# Patient Record
Sex: Female | Born: 1945 | Race: White | Hispanic: No | Marital: Married | Smoking: Never smoker
Health system: Southern US, Community
[De-identification: ages and names within clinical notes are randomized; demographics above are authoritative.]

## PROBLEM LIST (undated history)

## (undated) DIAGNOSIS — C50919 Malignant neoplasm of unspecified site of unspecified female breast: Secondary | ICD-10-CM

## (undated) DIAGNOSIS — K5730 Diverticulosis of large intestine without perforation or abscess without bleeding: Secondary | ICD-10-CM

## (undated) DIAGNOSIS — I10 Essential (primary) hypertension: Secondary | ICD-10-CM

## (undated) DIAGNOSIS — C7931 Secondary malignant neoplasm of brain: Secondary | ICD-10-CM

## (undated) DIAGNOSIS — I00 Rheumatic fever without heart involvement: Secondary | ICD-10-CM

## (undated) DIAGNOSIS — I2510 Atherosclerotic heart disease of native coronary artery without angina pectoris: Secondary | ICD-10-CM

## (undated) DIAGNOSIS — F419 Anxiety disorder, unspecified: Secondary | ICD-10-CM

## (undated) DIAGNOSIS — M810 Age-related osteoporosis without current pathological fracture: Secondary | ICD-10-CM

## (undated) DIAGNOSIS — G589 Mononeuropathy, unspecified: Secondary | ICD-10-CM

## (undated) DIAGNOSIS — E785 Hyperlipidemia, unspecified: Secondary | ICD-10-CM

## (undated) DIAGNOSIS — M549 Dorsalgia, unspecified: Secondary | ICD-10-CM

## (undated) HISTORY — PX: CHOLECYSTECTOMY: SHX55

## (undated) HISTORY — DX: Mononeuropathy, unspecified: G58.9

## (undated) HISTORY — DX: Rheumatic fever without heart involvement: I00

## (undated) HISTORY — DX: Hyperlipidemia, unspecified: E78.5

## (undated) HISTORY — DX: Essential (primary) hypertension: I10

## (undated) HISTORY — DX: Dorsalgia, unspecified: M54.9

## (undated) HISTORY — DX: Secondary malignant neoplasm of brain: C79.31

## (undated) HISTORY — DX: Atherosclerotic heart disease of native coronary artery without angina pectoris: I25.10

## (undated) HISTORY — DX: Age-related osteoporosis without current pathological fracture: M81.0

## (undated) HISTORY — DX: Malignant neoplasm of unspecified site of unspecified female breast: C50.919

## (undated) HISTORY — DX: Diverticulosis of large intestine without perforation or abscess without bleeding: K57.30

## (undated) HISTORY — DX: Anxiety disorder, unspecified: F41.9

---

## 1969-01-09 HISTORY — PX: TONSILLECTOMY AND ADENOIDECTOMY: SHX28

## 1972-01-10 HISTORY — PX: ANKLE ARTHROSCOPY W/ OPEN REPAIR: SHX1145

## 1997-12-29 ENCOUNTER — Ambulatory Visit (HOSPITAL_COMMUNITY): Admission: RE | Admit: 1997-12-29 | Discharge: 1997-12-29 | Payer: Self-pay | Admitting: Family Medicine

## 2002-01-09 HISTORY — PX: CAROTID ENDARTERECTOMY: SUR193

## 2004-05-10 ENCOUNTER — Ambulatory Visit: Payer: Self-pay | Admitting: Family Medicine

## 2004-06-13 ENCOUNTER — Ambulatory Visit: Payer: Self-pay | Admitting: General Surgery

## 2004-06-13 LAB — HM COLONOSCOPY

## 2005-01-09 HISTORY — PX: BREAST LUMPECTOMY: SHX2

## 2005-11-03 ENCOUNTER — Ambulatory Visit: Payer: Self-pay | Admitting: General Surgery

## 2005-11-20 ENCOUNTER — Ambulatory Visit: Payer: Self-pay | Admitting: Oncology

## 2005-11-24 ENCOUNTER — Ambulatory Visit: Payer: Self-pay | Admitting: General Surgery

## 2005-12-09 ENCOUNTER — Ambulatory Visit: Payer: Self-pay | Admitting: Oncology

## 2006-01-09 ENCOUNTER — Ambulatory Visit: Payer: Self-pay | Admitting: Oncology

## 2006-02-09 ENCOUNTER — Ambulatory Visit: Payer: Self-pay | Admitting: Oncology

## 2006-03-10 ENCOUNTER — Ambulatory Visit: Payer: Self-pay | Admitting: Oncology

## 2006-04-10 ENCOUNTER — Ambulatory Visit: Payer: Self-pay | Admitting: Oncology

## 2006-04-26 ENCOUNTER — Ambulatory Visit: Payer: Self-pay | Admitting: General Surgery

## 2006-05-10 ENCOUNTER — Ambulatory Visit: Payer: Self-pay | Admitting: Oncology

## 2006-06-10 ENCOUNTER — Ambulatory Visit: Payer: Self-pay | Admitting: Oncology

## 2006-07-10 ENCOUNTER — Ambulatory Visit: Payer: Self-pay | Admitting: Oncology

## 2006-08-10 ENCOUNTER — Ambulatory Visit: Payer: Self-pay | Admitting: Oncology

## 2006-09-10 ENCOUNTER — Ambulatory Visit: Payer: Self-pay | Admitting: Oncology

## 2006-10-10 ENCOUNTER — Ambulatory Visit: Payer: Self-pay | Admitting: Oncology

## 2006-12-10 ENCOUNTER — Ambulatory Visit: Payer: Self-pay | Admitting: Oncology

## 2006-12-10 HISTORY — PX: FOOT FRACTURE SURGERY: SHX645

## 2006-12-27 ENCOUNTER — Ambulatory Visit: Payer: Self-pay | Admitting: Oncology

## 2007-01-10 ENCOUNTER — Ambulatory Visit: Payer: Self-pay | Admitting: Oncology

## 2007-02-10 ENCOUNTER — Ambulatory Visit: Payer: Self-pay | Admitting: Oncology

## 2007-02-28 ENCOUNTER — Ambulatory Visit: Payer: Self-pay | Admitting: Oncology

## 2007-03-10 ENCOUNTER — Ambulatory Visit: Payer: Self-pay | Admitting: Oncology

## 2007-04-10 ENCOUNTER — Ambulatory Visit: Payer: Self-pay | Admitting: Oncology

## 2007-04-30 ENCOUNTER — Ambulatory Visit: Payer: Self-pay | Admitting: Oncology

## 2007-05-10 ENCOUNTER — Ambulatory Visit: Payer: Self-pay | Admitting: Oncology

## 2007-08-10 ENCOUNTER — Ambulatory Visit: Payer: Self-pay | Admitting: Oncology

## 2007-08-21 ENCOUNTER — Ambulatory Visit: Payer: Self-pay | Admitting: Oncology

## 2007-09-10 ENCOUNTER — Ambulatory Visit: Payer: Self-pay | Admitting: Oncology

## 2007-09-26 ENCOUNTER — Ambulatory Visit: Payer: Self-pay | Admitting: General Surgery

## 2007-10-30 ENCOUNTER — Encounter: Payer: Self-pay | Admitting: Internal Medicine

## 2007-10-30 LAB — CONVERTED CEMR LAB

## 2007-12-10 ENCOUNTER — Ambulatory Visit: Payer: Self-pay | Admitting: Family Medicine

## 2008-02-10 ENCOUNTER — Ambulatory Visit: Payer: Self-pay | Admitting: Oncology

## 2008-02-20 ENCOUNTER — Ambulatory Visit: Payer: Self-pay | Admitting: Oncology

## 2008-03-09 ENCOUNTER — Ambulatory Visit: Payer: Self-pay | Admitting: Oncology

## 2008-06-09 ENCOUNTER — Ambulatory Visit: Payer: Self-pay | Admitting: Oncology

## 2008-06-17 ENCOUNTER — Encounter: Payer: Self-pay | Admitting: Internal Medicine

## 2008-07-03 ENCOUNTER — Encounter: Payer: Self-pay | Admitting: Internal Medicine

## 2008-07-03 ENCOUNTER — Ambulatory Visit: Payer: Self-pay | Admitting: Oncology

## 2008-07-09 ENCOUNTER — Ambulatory Visit: Payer: Self-pay | Admitting: Oncology

## 2008-08-18 ENCOUNTER — Ambulatory Visit: Payer: Self-pay | Admitting: Internal Medicine

## 2008-08-18 DIAGNOSIS — M549 Dorsalgia, unspecified: Secondary | ICD-10-CM | POA: Insufficient documentation

## 2008-08-18 DIAGNOSIS — R002 Palpitations: Secondary | ICD-10-CM

## 2008-08-18 DIAGNOSIS — I10 Essential (primary) hypertension: Secondary | ICD-10-CM | POA: Insufficient documentation

## 2008-08-18 DIAGNOSIS — Z853 Personal history of malignant neoplasm of breast: Secondary | ICD-10-CM | POA: Insufficient documentation

## 2008-08-18 DIAGNOSIS — L500 Allergic urticaria: Secondary | ICD-10-CM

## 2008-08-18 DIAGNOSIS — K5730 Diverticulosis of large intestine without perforation or abscess without bleeding: Secondary | ICD-10-CM | POA: Insufficient documentation

## 2008-08-18 DIAGNOSIS — E785 Hyperlipidemia, unspecified: Secondary | ICD-10-CM

## 2008-08-18 DIAGNOSIS — G589 Mononeuropathy, unspecified: Secondary | ICD-10-CM | POA: Insufficient documentation

## 2008-08-18 DIAGNOSIS — F411 Generalized anxiety disorder: Secondary | ICD-10-CM | POA: Insufficient documentation

## 2008-08-18 DIAGNOSIS — M810 Age-related osteoporosis without current pathological fracture: Secondary | ICD-10-CM | POA: Insufficient documentation

## 2008-08-31 ENCOUNTER — Telehealth: Payer: Self-pay | Admitting: Internal Medicine

## 2008-09-02 ENCOUNTER — Encounter: Payer: Self-pay | Admitting: Internal Medicine

## 2008-09-22 ENCOUNTER — Ambulatory Visit: Payer: Self-pay | Admitting: General Surgery

## 2008-09-22 ENCOUNTER — Encounter: Payer: Self-pay | Admitting: Internal Medicine

## 2008-09-29 ENCOUNTER — Ambulatory Visit: Payer: Self-pay | Admitting: General Surgery

## 2008-10-13 ENCOUNTER — Telehealth: Payer: Self-pay | Admitting: Internal Medicine

## 2008-10-14 ENCOUNTER — Ambulatory Visit: Payer: Self-pay | Admitting: Family Medicine

## 2008-10-14 DIAGNOSIS — S335XXA Sprain of ligaments of lumbar spine, initial encounter: Secondary | ICD-10-CM | POA: Insufficient documentation

## 2008-10-21 ENCOUNTER — Telehealth: Payer: Self-pay | Admitting: Family Medicine

## 2008-10-23 ENCOUNTER — Telehealth: Payer: Self-pay | Admitting: Internal Medicine

## 2008-11-24 ENCOUNTER — Telehealth: Payer: Self-pay | Admitting: Internal Medicine

## 2008-12-04 ENCOUNTER — Ambulatory Visit: Payer: Self-pay | Admitting: Internal Medicine

## 2008-12-09 ENCOUNTER — Telehealth: Payer: Self-pay | Admitting: Internal Medicine

## 2008-12-09 ENCOUNTER — Ambulatory Visit: Payer: Self-pay | Admitting: Oncology

## 2008-12-09 ENCOUNTER — Telehealth: Payer: Self-pay | Admitting: Family Medicine

## 2008-12-09 ENCOUNTER — Ambulatory Visit: Payer: Self-pay | Admitting: Family Medicine

## 2008-12-09 DIAGNOSIS — IMO0002 Reserved for concepts with insufficient information to code with codable children: Secondary | ICD-10-CM | POA: Insufficient documentation

## 2008-12-09 DIAGNOSIS — M1610 Unilateral primary osteoarthritis, unspecified hip: Secondary | ICD-10-CM | POA: Insufficient documentation

## 2008-12-09 DIAGNOSIS — M5137 Other intervertebral disc degeneration, lumbosacral region: Secondary | ICD-10-CM

## 2008-12-09 DIAGNOSIS — M169 Osteoarthritis of hip, unspecified: Secondary | ICD-10-CM

## 2008-12-14 ENCOUNTER — Telehealth: Payer: Self-pay | Admitting: Internal Medicine

## 2008-12-16 ENCOUNTER — Ambulatory Visit: Payer: Self-pay | Admitting: Internal Medicine

## 2008-12-18 ENCOUNTER — Telehealth: Payer: Self-pay | Admitting: Internal Medicine

## 2008-12-21 ENCOUNTER — Telehealth: Payer: Self-pay | Admitting: Internal Medicine

## 2008-12-28 ENCOUNTER — Ambulatory Visit: Payer: Self-pay | Admitting: Oncology

## 2008-12-29 ENCOUNTER — Telehealth: Payer: Self-pay | Admitting: Internal Medicine

## 2008-12-31 ENCOUNTER — Telehealth: Payer: Self-pay | Admitting: Internal Medicine

## 2009-01-05 ENCOUNTER — Telehealth: Payer: Self-pay | Admitting: Internal Medicine

## 2009-01-06 ENCOUNTER — Telehealth: Payer: Self-pay | Admitting: Internal Medicine

## 2009-01-07 ENCOUNTER — Ambulatory Visit: Payer: Self-pay | Admitting: Internal Medicine

## 2009-01-09 ENCOUNTER — Ambulatory Visit: Payer: Self-pay | Admitting: Oncology

## 2009-01-14 ENCOUNTER — Telehealth: Payer: Self-pay | Admitting: Internal Medicine

## 2009-01-20 ENCOUNTER — Telehealth: Payer: Self-pay | Admitting: Internal Medicine

## 2009-01-28 ENCOUNTER — Ambulatory Visit: Payer: Self-pay | Admitting: Oncology

## 2009-02-09 ENCOUNTER — Inpatient Hospital Stay: Payer: Self-pay | Admitting: Internal Medicine

## 2009-02-09 ENCOUNTER — Ambulatory Visit: Payer: Self-pay | Admitting: Oncology

## 2009-02-16 ENCOUNTER — Telehealth: Payer: Self-pay | Admitting: Internal Medicine

## 2009-02-19 ENCOUNTER — Telehealth: Payer: Self-pay | Admitting: Internal Medicine

## 2009-02-21 ENCOUNTER — Emergency Department: Payer: Self-pay | Admitting: Emergency Medicine

## 2009-02-26 ENCOUNTER — Inpatient Hospital Stay: Payer: Self-pay | Admitting: Oncology

## 2009-03-09 ENCOUNTER — Ambulatory Visit: Payer: Self-pay | Admitting: Oncology

## 2009-04-09 ENCOUNTER — Ambulatory Visit: Payer: Self-pay | Admitting: Oncology

## 2009-05-09 ENCOUNTER — Ambulatory Visit: Payer: Self-pay | Admitting: Oncology

## 2009-06-09 ENCOUNTER — Ambulatory Visit: Payer: Self-pay | Admitting: Oncology

## 2009-07-09 ENCOUNTER — Ambulatory Visit: Payer: Self-pay | Admitting: Oncology

## 2009-07-15 LAB — CANCER ANTIGEN 27.29: CA 27.29: 32.1 U/mL (ref 0.0–38.6)

## 2009-07-23 ENCOUNTER — Ambulatory Visit: Payer: Self-pay | Admitting: Oncology

## 2009-07-30 LAB — CANCER ANTIGEN 27.29: CA 27.29: 22.4 U/mL

## 2009-08-09 ENCOUNTER — Ambulatory Visit: Payer: Self-pay | Admitting: Oncology

## 2009-08-26 LAB — CANCER ANTIGEN 27.29: CA 27.29: 22.2 U/mL (ref 0.0–38.6)

## 2009-08-31 ENCOUNTER — Encounter: Payer: Self-pay | Admitting: Oncology

## 2009-09-09 ENCOUNTER — Ambulatory Visit: Payer: Self-pay | Admitting: Oncology

## 2009-09-09 ENCOUNTER — Encounter: Payer: Self-pay | Admitting: Oncology

## 2009-10-09 ENCOUNTER — Ambulatory Visit: Payer: Self-pay | Admitting: Oncology

## 2009-11-09 ENCOUNTER — Ambulatory Visit: Payer: Self-pay | Admitting: Oncology

## 2009-12-09 ENCOUNTER — Ambulatory Visit: Payer: Self-pay | Admitting: Oncology

## 2010-01-09 ENCOUNTER — Ambulatory Visit: Payer: Self-pay | Admitting: Oncology

## 2010-02-06 LAB — CONVERTED CEMR LAB
ALT: 20 units/L (ref 0–35)
AST: 20 units/L (ref 0–37)
Albumin: 4.6 g/dL (ref 3.5–5.2)
Alkaline Phosphatase: 86 units/L (ref 39–117)
BUN: 8 mg/dL (ref 6–23)
Basophils Absolute: 0 10*3/uL (ref 0.0–0.1)
Basophils Relative: 0.1 % (ref 0.0–3.0)
Bilirubin Urine: NEGATIVE
Bilirubin, Direct: 0 mg/dL (ref 0.0–0.3)
CO2: 34 meq/L — ABNORMAL HIGH (ref 19–32)
Calcium: 9.5 mg/dL (ref 8.4–10.5)
Chloride: 103 meq/L (ref 96–112)
Cholesterol: 209 mg/dL — ABNORMAL HIGH (ref 0–200)
Creatinine, Ser: 0.8 mg/dL (ref 0.4–1.2)
Direct LDL: 127.9 mg/dL
Eosinophils Absolute: 0.1 10*3/uL (ref 0.0–0.7)
Eosinophils Relative: 2 % (ref 0.0–5.0)
Free T4: 0.7 ng/dL (ref 0.6–1.6)
Glucose, Bld: 76 mg/dL (ref 70–99)
Glucose, Urine, Semiquant: NEGATIVE
HCT: 40.1 % (ref 36.0–46.0)
HDL: 67.2 mg/dL (ref >39.00)
Hemoglobin: 13.7 g/dL (ref 12.0–15.0)
Ketones, urine, test strip: NEGATIVE
Lymphocytes Relative: 15.4 % (ref 12.0–46.0)
Lymphs Abs: 0.9 10*3/uL (ref 0.7–4.0)
MCHC: 34.2 g/dL (ref 30.0–36.0)
MCV: 99.4 fL (ref 78.0–100.0)
Monocytes Absolute: 0.6 10*3/uL (ref 0.1–1.0)
Monocytes Relative: 10.2 % (ref 3.0–12.0)
Neutro Abs: 4.3 10*3/uL (ref 1.4–7.7)
Neutrophils Relative %: 72.3 % (ref 43.0–77.0)
Nitrite: NEGATIVE
Phosphorus: 4.3 mg/dL (ref 2.3–4.6)
Platelets: 290 10*3/uL (ref 150.0–400.0)
Potassium: 3.9 meq/L (ref 3.5–5.1)
RBC: 4.03 M/uL (ref 3.87–5.11)
RDW: 12.3 % (ref 11.5–14.6)
Sodium: 145 meq/L (ref 135–145)
Specific Gravity, Urine: 1.025
TSH: 1.56 microintl units/mL (ref 0.35–5.50)
Total Bilirubin: 0.9 mg/dL (ref 0.3–1.2)
Total CHOL/HDL Ratio: 3
Total Protein: 7.3 g/dL (ref 6.0–8.3)
Triglycerides: 139 mg/dL (ref 0.0–149.0)
Urobilinogen, UA: 0.2
VLDL: 27.8 mg/dL (ref 0.0–40.0)
Vit D, 25-Hydroxy: 25 ng/mL — ABNORMAL LOW (ref 30–89)
WBC: 5.9 10*3/uL (ref 4.5–10.5)
pH: 6.5

## 2010-02-09 ENCOUNTER — Ambulatory Visit: Payer: Self-pay | Admitting: Oncology

## 2010-02-10 NOTE — Progress Notes (Signed)
Summary: refill request for xanax  Phone Note Refill Request Message from:  Fax from Pharmacy  Refills Requested: Medication #1:  ALPRAZOLAM 0.5 MG TABS take 1/2 to 1 by mouth two times a day as needed   Last Refilled: 12/31/2008 Faxed request from [employer] is on your desk.  Initial call taken by: Lowella Petties CMA,  February 16, 2009 2:52 PM  Follow-up for Phone Call        okay #60 x 1 Follow-up by: Cindee Salt MD,  February 16, 2009 3:02 PM  Additional Follow-up for Phone Call Additional follow up Details #1::        Rx faxed to pharmacy Additional Follow-up by: DeShannon Smith CMA Duncan Dull),  February 16, 2009 3:31 PM    Prescriptions: ALPRAZOLAM 0.5 MG TABS (ALPRAZOLAM) take 1/2 to 1 by mouth two times a day as needed  #60 x 1   Entered by:   Mervin Hack CMA (AAMA)   Authorized by:   Cindee Salt MD   Signed by:   Mervin Hack CMA (AAMA) on 02/16/2009   Method used:   Handwritten   RxID:   [id number]

## 2010-02-10 NOTE — Progress Notes (Signed)
Summary: Percocet, Tizanidine and Fentanyl  Phone Note Call from Patient Call back at [phone]   Caller: Patient Call For: Donna Salt MD Summary of Call: Patient needs written rx's for Percocet, Tizanidine 4mg  and  Fentanyl patch. Patient states that the dose of the Fentanyl patch is not helping that much and wants to know if you want to increase that?  Patient's husband would like to pick them up this afternoon. Initial call taken by: Sydell Axon LPN,  January 20, 2009 1:04 PM  Follow-up for Phone Call        Patch increased to 50 micrograms (doubled the dose) tizanidine not due--2 refills written in December Follow-up by: Donna Salt MD,  January 20, 2009 1:40 PM  Additional Follow-up for Phone Call Additional follow up Details #1::        Spoke with patient and advised rx ready for pick-up, her husband will pick-up and she will call the tizanidine in to the pharmacy. Additional Follow-up by: Mervin Hack CMA Duncan Dull),  January 20, 2009 2:48 PM    New/Updated Medications: FENTANYL 50 MCG/HR PT72 (FENTANYL) apply 1 patch every 3 days Prescriptions: PERCOCET 5-325 MG TABS (OXYCODONE-ACETAMINOPHEN) 1 - 2 tabs every 4-6 hours by mouth as needed for pain  #90 x 0   Entered and Authorized by:   Donna Salt MD   Signed by:   Donna Salt MD on 01/20/2009   Method used:   Print then Give to Patient   RxID:   [id number] FENTANYL 50 MCG/HR PT72 (FENTANYL) apply 1 patch every 3 days  #10 x 0   Entered and Authorized by:   Donna Salt MD   Signed by:   Donna Salt MD on 01/20/2009   Method used:   Print then Give to Patient   RxID:   [id number]

## 2010-02-10 NOTE — Progress Notes (Signed)
Summary: Knee pain  Phone Note Call from Patient Call back at Home Phone [phone]   Caller: Patient Summary of Call: patient and husband calling complaining of a swollen knee, I advised pt and husband that Dr. Alphonsus Sias is not in and that I could get her in to see another physican, husband states he doesn't trust the other physicans here. Pt and husband wanted [place] to call Dr. Koleen Nimrod and see if he could see pt, I told them we couldn't do that, they would need to call, husband said he would call and call us back. Initial call taken by: Mervin Hack CMA Duncan Dull),  January 14, 2009 8:46 AM  Follow-up for Phone Call        called to check on pt and she was seen today by Dr. Montez Morita (Dr. Koleen Nimrod was out) they did a ultrasound which was negative and put pt on prednisone. Follow-up by: Mervin Hack CMA Duncan Dull),  January 14, 2009 2:05 PM  Additional Follow-up for Phone Call Additional follow up Details #1::        Please check on her on Monday Okay to add on if she needs to be seen Additional Follow-up by: Cindee Salt MD,  January 15, 2009 8:30 PM    Additional Follow-up for Phone Call Additional follow up Details #2::    no answer at home number [facility] CMA Duncan Dull)  January 19, 2009 9:35 AM

## 2010-02-10 NOTE — Progress Notes (Signed)
----   Converted from flag ---- ---- 02/19/2009 5:50 PM, Cindee Salt MD wrote: Sherron Monday to patient then husband Bone scan and MRI showed cancer Now on RT and chemo  ---- 02/19/2009 2:23 PM, DeShannon Katrinka Blazing CMA Duncan Dull) wrote: Cleatis Polka called and left message on triage voicemail that his wife has cancer and that's all he's gonna say because he wasn't going to talk to a recording. Do you want me to call him or do you want to? ------------------------------

## 2010-03-10 ENCOUNTER — Ambulatory Visit: Payer: Self-pay | Admitting: Oncology

## 2010-04-05 ENCOUNTER — Ambulatory Visit: Payer: Self-pay | Admitting: Oncology

## 2010-04-10 ENCOUNTER — Ambulatory Visit: Payer: Self-pay | Admitting: Oncology

## 2010-05-10 ENCOUNTER — Ambulatory Visit: Payer: Self-pay | Admitting: Oncology

## 2010-06-10 ENCOUNTER — Ambulatory Visit: Payer: Self-pay | Admitting: Oncology

## 2010-07-10 ENCOUNTER — Ambulatory Visit: Payer: Self-pay | Admitting: Oncology

## 2010-08-02 LAB — CEA: CEA: 3.6 ng/mL (ref 0.0–4.7)

## 2010-08-10 ENCOUNTER — Ambulatory Visit: Payer: Self-pay | Admitting: Oncology

## 2010-09-10 ENCOUNTER — Ambulatory Visit: Payer: Self-pay | Admitting: Oncology

## 2010-09-27 LAB — CANCER ANTIGEN 27.29: CA 27.29: 36.8 U/mL (ref 0.0–38.6)

## 2010-10-10 ENCOUNTER — Ambulatory Visit: Payer: Self-pay | Admitting: Oncology

## 2010-10-25 LAB — CANCER ANTIGEN 27.29: CA 27.29: 43.8 U/mL — ABNORMAL HIGH (ref 0.0–38.6)

## 2010-11-10 ENCOUNTER — Ambulatory Visit: Payer: Self-pay | Admitting: Oncology

## 2010-12-10 ENCOUNTER — Ambulatory Visit: Payer: Self-pay | Admitting: Oncology

## 2010-12-19 ENCOUNTER — Ambulatory Visit: Payer: Self-pay | Admitting: Oncology

## 2011-01-10 ENCOUNTER — Ambulatory Visit: Payer: Self-pay | Admitting: Oncology

## 2011-01-23 LAB — CBC CANCER CENTER
Basophil #: 0 x10 3/mm (ref 0.0–0.1)
Eosinophil #: 0.1 x10 3/mm (ref 0.0–0.7)
HCT: 37.4 % (ref 35.0–47.0)
HGB: 13 g/dL (ref 12.0–16.0)
Lymphocyte %: 15.8 %
MCH: 30.7 pg (ref 26.0–34.0)
MCHC: 34.7 g/dL (ref 32.0–36.0)
MCV: 89 fL (ref 80–100)
Monocyte #: 0.3 x10 3/mm (ref 0.0–0.7)
Monocyte %: 7.6 %
Neutrophil #: 2.6 x10 3/mm (ref 1.4–6.5)
Neutrophil %: 72.6 %
Platelet: 194 x10 3/mm (ref 150–440)
RBC: 4.22 10*6/uL (ref 3.80–5.20)
RDW: 12.6 % (ref 11.5–14.5)

## 2011-01-23 LAB — COMPREHENSIVE METABOLIC PANEL
Albumin: 3.6 g/dL (ref 3.4–5.0)
Alkaline Phosphatase: 117 U/L (ref 50–136)
BUN: 7 mg/dL (ref 7–18)
Creatinine: 1.4 mg/dL — ABNORMAL HIGH (ref 0.60–1.30)
Glucose: 124 mg/dL — ABNORMAL HIGH (ref 65–99)
Potassium: 3.1 mmol/L — ABNORMAL LOW (ref 3.5–5.1)
SGOT(AST): 22 U/L (ref 15–37)
SGPT (ALT): 37 U/L
Total Protein: 6.9 g/dL (ref 6.4–8.2)

## 2011-01-24 LAB — CANCER ANTIGEN 27.29: CA 27.29: 32.8 U/mL (ref 0.0–38.6)

## 2011-02-10 ENCOUNTER — Ambulatory Visit: Payer: Self-pay | Admitting: Oncology

## 2011-02-20 LAB — CBC CANCER CENTER
Basophil #: 0 x10 3/mm (ref 0.0–0.1)
Eosinophil #: 0.1 x10 3/mm (ref 0.0–0.7)
Eosinophil %: 2.8 %
HCT: 35.8 % (ref 35.0–47.0)
Lymphocyte %: 14.7 %
MCHC: 34 g/dL (ref 32.0–36.0)
Monocyte #: 0.4 x10 3/mm (ref 0.0–0.7)
Monocyte %: 8.9 %
Neutrophil %: 72.9 %
Platelet: 302 x10 3/mm (ref 150–440)
RBC: 4.15 10*6/uL (ref 3.80–5.20)
WBC: 4.6 x10 3/mm (ref 3.6–11.0)

## 2011-02-20 LAB — COMPREHENSIVE METABOLIC PANEL
Albumin: 3.4 g/dL (ref 3.4–5.0)
Alkaline Phosphatase: 114 U/L (ref 50–136)
Anion Gap: 10 (ref 7–16)
BUN: 9 mg/dL (ref 7–18)
Bilirubin,Total: 0.7 mg/dL (ref 0.2–1.0)
Calcium, Total: 8.8 mg/dL (ref 8.5–10.1)
Chloride: 103 mmol/L (ref 98–107)
Co2: 30 mmol/L (ref 21–32)
Creatinine: 1.33 mg/dL — ABNORMAL HIGH (ref 0.60–1.30)
EGFR (Non-African Amer.): 42 — ABNORMAL LOW
Osmolality: 284 (ref 275–301)
SGOT(AST): 17 U/L (ref 15–37)
SGPT (ALT): 23 U/L
Total Protein: 7.1 g/dL (ref 6.4–8.2)

## 2011-02-21 LAB — CANCER ANTIGEN 27.29: CA 27.29: 40.4 U/mL — ABNORMAL HIGH (ref 0.0–38.6)

## 2011-03-10 ENCOUNTER — Ambulatory Visit: Payer: Self-pay | Admitting: Oncology

## 2011-03-20 LAB — COMPREHENSIVE METABOLIC PANEL
Albumin: 2.9 g/dL — ABNORMAL LOW (ref 3.4–5.0)
Alkaline Phosphatase: 102 U/L (ref 50–136)
BUN: 21 mg/dL — ABNORMAL HIGH (ref 7–18)
Glucose: 133 mg/dL — ABNORMAL HIGH (ref 65–99)
Potassium: 3.5 mmol/L (ref 3.5–5.1)
SGOT(AST): 22 U/L (ref 15–37)
SGPT (ALT): 19 U/L
Total Protein: 7.5 g/dL (ref 6.4–8.2)

## 2011-03-20 LAB — CBC CANCER CENTER
Basophil #: 0 x10 3/mm (ref 0.0–0.1)
Eosinophil #: 0.1 x10 3/mm (ref 0.0–0.7)
Eosinophil %: 1 %
Lymphocyte #: 0.5 x10 3/mm — ABNORMAL LOW (ref 1.0–3.6)
Lymphocyte %: 6.2 %
MCHC: 34 g/dL (ref 32.0–36.0)
MCV: 80 fL (ref 80–100)
Monocyte #: 0.6 x10 3/mm (ref 0.0–0.7)
Monocyte %: 7.3 %
Neutrophil #: 6.7 x10 3/mm — ABNORMAL HIGH (ref 1.4–6.5)
Neutrophil %: 85.2 %
Platelet: 320 x10 3/mm (ref 150–440)
RBC: 3.65 10*6/uL — ABNORMAL LOW (ref 3.80–5.20)
RDW: 14.7 % — ABNORMAL HIGH (ref 11.5–14.5)
WBC: 7.8 x10 3/mm (ref 3.6–11.0)

## 2011-03-27 LAB — COMPREHENSIVE METABOLIC PANEL
Anion Gap: 12 (ref 7–16)
Bilirubin,Total: 0.9 mg/dL (ref 0.2–1.0)
Chloride: 101 mmol/L (ref 98–107)
Co2: 26 mmol/L (ref 21–32)
Creatinine: 1.54 mg/dL — ABNORMAL HIGH (ref 0.60–1.30)
EGFR (African American): 43 — ABNORMAL LOW
EGFR (Non-African Amer.): 36 — ABNORMAL LOW
Osmolality: 277 (ref 275–301)
Potassium: 3.2 mmol/L — ABNORMAL LOW (ref 3.5–5.1)
SGPT (ALT): 25 U/L
Sodium: 139 mmol/L (ref 136–145)
Total Protein: 7.3 g/dL (ref 6.4–8.2)

## 2011-03-27 LAB — CBC WITH DIFFERENTIAL/PLATELET
Basophil #: 0 10*3/uL (ref 0.0–0.1)
Basophil %: 0.7 %
Eosinophil #: 0.1 10*3/uL (ref 0.0–0.7)
Eosinophil %: 1.4 %
Lymphocyte #: 0.5 10*3/uL — ABNORMAL LOW (ref 1.0–3.6)
MCH: 26.8 pg (ref 26.0–34.0)
MCHC: 33.8 g/dL (ref 32.0–36.0)
MCV: 79 fL — ABNORMAL LOW (ref 80–100)
Monocyte #: 0.5 10*3/uL (ref 0.0–0.7)
Platelet: 490 10*3/uL — ABNORMAL HIGH (ref 150–440)
RBC: 3.37 10*6/uL — ABNORMAL LOW (ref 3.80–5.20)

## 2011-04-04 LAB — CBC CANCER CENTER
Basophil #: 0 x10 3/mm (ref 0.0–0.1)
Basophil %: 0.4 %
HCT: 26.4 % — ABNORMAL LOW (ref 35.0–47.0)
HGB: 8.9 g/dL — ABNORMAL LOW (ref 12.0–16.0)
MCHC: 33.5 g/dL (ref 32.0–36.0)
MCV: 80 fL (ref 80–100)
Monocyte #: 0.4 x10 3/mm (ref 0.0–0.7)
Neutrophil #: 5.9 x10 3/mm (ref 1.4–6.5)
Neutrophil %: 81.5 %
Platelet: 500 x10 3/mm — ABNORMAL HIGH (ref 150–440)
RDW: 16.4 % — ABNORMAL HIGH (ref 11.5–14.5)

## 2011-04-04 LAB — COMPREHENSIVE METABOLIC PANEL
Albumin: 3.1 g/dL — ABNORMAL LOW (ref 3.4–5.0)
BUN: 14 mg/dL (ref 7–18)
Calcium, Total: 9.4 mg/dL (ref 8.5–10.1)
Chloride: 101 mmol/L (ref 98–107)
Co2: 27 mmol/L (ref 21–32)
Creatinine: 1.65 mg/dL — ABNORMAL HIGH (ref 0.60–1.30)
Glucose: 75 mg/dL (ref 65–99)
SGOT(AST): 27 U/L (ref 15–37)
SGPT (ALT): 27 U/L
Sodium: 140 mmol/L (ref 136–145)

## 2011-04-10 ENCOUNTER — Ambulatory Visit: Payer: Self-pay | Admitting: Oncology

## 2011-04-13 ENCOUNTER — Ambulatory Visit: Payer: Self-pay | Admitting: General Surgery

## 2011-04-17 ENCOUNTER — Ambulatory Visit: Payer: Self-pay | Admitting: Oncology

## 2011-04-17 LAB — CBC CANCER CENTER
Basophil %: 0.6 %
HGB: 9.4 g/dL — ABNORMAL LOW (ref 12.0–16.0)
Lymphocyte #: 0.7 x10 3/mm — ABNORMAL LOW (ref 1.0–3.6)
MCH: 27.8 pg (ref 26.0–34.0)
MCHC: 33.7 g/dL (ref 32.0–36.0)
MCV: 83 fL (ref 80–100)
Monocyte %: 7.3 %
Neutrophil #: 4.8 x10 3/mm (ref 1.4–6.5)
Neutrophil %: 75.7 %
RDW: 23 % — ABNORMAL HIGH (ref 11.5–14.5)

## 2011-04-17 LAB — BASIC METABOLIC PANEL
Calcium, Total: 10 mg/dL (ref 8.5–10.1)
Creatinine: 1.64 mg/dL — ABNORMAL HIGH (ref 0.60–1.30)
EGFR (African American): 40 — ABNORMAL LOW
EGFR (Non-African Amer.): 33 — ABNORMAL LOW
Glucose: 89 mg/dL (ref 65–99)
Osmolality: 277 (ref 275–301)
Sodium: 138 mmol/L (ref 136–145)

## 2011-04-23 ENCOUNTER — Emergency Department: Payer: Self-pay | Admitting: Emergency Medicine

## 2011-04-23 LAB — COMPREHENSIVE METABOLIC PANEL
Albumin: 3.1 g/dL — ABNORMAL LOW (ref 3.4–5.0)
Anion Gap: 8 (ref 7–16)
BUN: 13 mg/dL (ref 7–18)
Bilirubin,Total: 0.9 mg/dL (ref 0.2–1.0)
Co2: 29 mmol/L (ref 21–32)
Creatinine: 1.29 mg/dL (ref 0.60–1.30)
EGFR (African American): 50 — ABNORMAL LOW
SGPT (ALT): 14 U/L
Total Protein: 6 g/dL — ABNORMAL LOW (ref 6.4–8.2)

## 2011-04-23 LAB — URINALYSIS, COMPLETE
Bilirubin,UR: NEGATIVE
Hyaline Cast: 1
Protein: 100
RBC,UR: <1 /HPF (ref 0–5)
Specific Gravity: 1.02 (ref 1.003–1.030)
Squamous Epithelial: 1

## 2011-04-23 LAB — CBC
MCHC: 32.9 g/dL (ref 32.0–36.0)
MCV: 82 fL (ref 80–100)

## 2011-04-24 LAB — CBC CANCER CENTER
Basophil %: 0.7 %
Eosinophil %: 1.1 %
HCT: 24.9 % — ABNORMAL LOW (ref 35.0–47.0)
HGB: 8.1 g/dL — ABNORMAL LOW (ref 12.0–16.0)
Lymphocyte #: 0.5 x10 3/mm — ABNORMAL LOW (ref 1.0–3.6)
Lymphocyte %: 15.9 %
MCH: 27 pg (ref 26.0–34.0)
MCHC: 32.5 g/dL (ref 32.0–36.0)
MCV: 83 fL (ref 80–100)
Neutrophil %: 73.7 %
RBC: 3 10*6/uL — ABNORMAL LOW (ref 3.80–5.20)
RDW: 22.7 % — ABNORMAL HIGH (ref 11.5–14.5)

## 2011-04-25 LAB — URINE CULTURE

## 2011-04-27 LAB — COMPREHENSIVE METABOLIC PANEL
Alkaline Phosphatase: 86 U/L (ref 50–136)
BUN: 8 mg/dL (ref 7–18)
Bilirubin,Total: 0.6 mg/dL (ref 0.2–1.0)
Calcium, Total: 9.2 mg/dL (ref 8.5–10.1)
Co2: 29 mmol/L (ref 21–32)
Creatinine: 1.42 mg/dL — ABNORMAL HIGH (ref 0.60–1.30)
EGFR (African American): 44 — ABNORMAL LOW
Glucose: 99 mg/dL (ref 65–99)
Potassium: 3.1 mmol/L — ABNORMAL LOW (ref 3.5–5.1)
SGOT(AST): 18 U/L (ref 15–37)
Total Protein: 6.2 g/dL — ABNORMAL LOW (ref 6.4–8.2)

## 2011-04-27 LAB — CBC CANCER CENTER
Basophil #: 0 x10 3/mm (ref 0.0–0.1)
Eosinophil #: 0 x10 3/mm (ref 0.0–0.7)
Lymphocyte #: 0.5 x10 3/mm — ABNORMAL LOW (ref 1.0–3.6)
MCH: 26.8 pg (ref 26.0–34.0)
Monocyte #: 0.5 x10 3/mm (ref 0.2–0.9)
Neutrophil #: 0.9 x10 3/mm — ABNORMAL LOW (ref 1.4–6.5)
Neutrophil %: 45.6 %
Platelet: 322 x10 3/mm (ref 150–440)
WBC: 1.9 x10 3/mm — CL (ref 3.6–11.0)

## 2011-05-02 LAB — CBC CANCER CENTER
Eosinophil #: 0 x10 3/mm (ref 0.0–0.7)
HCT: 35.6 % (ref 35.0–47.0)
HGB: 11.5 g/dL — ABNORMAL LOW (ref 12.0–16.0)
Lymphocyte #: 0.7 x10 3/mm — ABNORMAL LOW (ref 1.0–3.6)
MCH: 26.8 pg (ref 26.0–34.0)
MCHC: 32.3 g/dL (ref 32.0–36.0)
MCV: 83 fL (ref 80–100)
Monocyte #: 0.6 x10 3/mm (ref 0.2–0.9)
Monocyte %: 7.1 %
Neutrophil #: 6.9 x10 3/mm — ABNORMAL HIGH (ref 1.4–6.5)
Neutrophil %: 83.6 %
RBC: 4.29 10*6/uL (ref 3.80–5.20)
RDW: 20.1 % — ABNORMAL HIGH (ref 11.5–14.5)
WBC: 8.2 x10 3/mm (ref 3.6–11.0)

## 2011-05-02 LAB — COMPREHENSIVE METABOLIC PANEL
Anion Gap: 10 (ref 7–16)
BUN: 15 mg/dL (ref 7–18)
Bilirubin,Total: 0.9 mg/dL (ref 0.2–1.0)
Calcium, Total: 9.5 mg/dL (ref 8.5–10.1)
Chloride: 102 mmol/L (ref 98–107)
Creatinine: 1.41 mg/dL — ABNORMAL HIGH (ref 0.60–1.30)
EGFR (Non-African Amer.): 38 — ABNORMAL LOW
Glucose: 79 mg/dL (ref 65–99)
SGOT(AST): 19 U/L (ref 15–37)
Sodium: 142 mmol/L (ref 136–145)

## 2011-05-08 LAB — COMPREHENSIVE METABOLIC PANEL
Albumin: 3 g/dL — ABNORMAL LOW (ref 3.4–5.0)
Alkaline Phosphatase: 92 U/L (ref 50–136)
Bilirubin,Total: 0.7 mg/dL (ref 0.2–1.0)
Calcium, Total: 8.5 mg/dL (ref 8.5–10.1)
Chloride: 104 mmol/L (ref 98–107)
Co2: 26 mmol/L (ref 21–32)
EGFR (African American): 59 — ABNORMAL LOW
Glucose: 79 mg/dL (ref 65–99)
Osmolality: 276 (ref 275–301)
Potassium: 3.6 mmol/L (ref 3.5–5.1)
SGPT (ALT): 14 U/L

## 2011-05-08 LAB — CBC CANCER CENTER
Basophil %: 0.5 %
Eosinophil #: 0 x10 3/mm (ref 0.0–0.7)
Eosinophil %: 0.2 %
HCT: 35.3 % (ref 35.0–47.0)
HGB: 11.4 g/dL — ABNORMAL LOW (ref 12.0–16.0)
Lymphocyte %: 7.5 %
MCH: 26.9 pg (ref 26.0–34.0)
Monocyte #: 0.7 x10 3/mm (ref 0.2–0.9)
Monocyte %: 5.8 %
Neutrophil %: 86 %
Platelet: 392 x10 3/mm (ref 150–440)

## 2011-05-10 ENCOUNTER — Ambulatory Visit: Payer: Self-pay | Admitting: Oncology

## 2011-05-10 HISTORY — PX: OTHER SURGICAL HISTORY: SHX169

## 2011-05-15 LAB — CBC CANCER CENTER
Basophil #: 0 x10 3/mm (ref 0.0–0.1)
Basophil %: 1.2 %
Eosinophil #: 0 x10 3/mm (ref 0.0–0.7)
HCT: 30.6 % — ABNORMAL LOW (ref 35.0–47.0)
Lymphocyte #: 0.5 x10 3/mm — ABNORMAL LOW (ref 1.0–3.6)
Lymphocyte %: 22 %
MCH: 27.5 pg (ref 26.0–34.0)
MCHC: 33.1 g/dL (ref 32.0–36.0)
MCV: 83 fL (ref 80–100)
Monocyte #: 0.2 x10 3/mm (ref 0.2–0.9)
Monocyte %: 10.6 %
Neutrophil #: 1.5 x10 3/mm (ref 1.4–6.5)
Platelet: 339 x10 3/mm (ref 150–440)
RBC: 3.68 10*6/uL — ABNORMAL LOW (ref 3.80–5.20)
WBC: 2.3 x10 3/mm — ABNORMAL LOW (ref 3.6–11.0)

## 2011-05-15 LAB — BASIC METABOLIC PANEL
BUN: 8 mg/dL (ref 7–18)
Calcium, Total: 8.8 mg/dL (ref 8.5–10.1)
Co2: 33 mmol/L — ABNORMAL HIGH (ref 21–32)
EGFR (African American): >60
EGFR (Non-African Amer.): >60
Glucose: 78 mg/dL (ref 65–99)

## 2011-05-19 LAB — COMPREHENSIVE METABOLIC PANEL
Albumin: 2.8 g/dL — ABNORMAL LOW (ref 3.4–5.0)
Alkaline Phosphatase: 117 U/L (ref 50–136)
BUN: 20 mg/dL — ABNORMAL HIGH (ref 7–18)
Bilirubin,Total: 1.5 mg/dL — ABNORMAL HIGH (ref 0.2–1.0)
Calcium, Total: 8.9 mg/dL (ref 8.5–10.1)
Creatinine: 1.99 mg/dL — ABNORMAL HIGH (ref 0.60–1.30)
EGFR (African American): 29 — ABNORMAL LOW
Glucose: 100 mg/dL — ABNORMAL HIGH (ref 65–99)
Potassium: 3.2 mmol/L — ABNORMAL LOW (ref 3.5–5.1)
SGPT (ALT): 15 U/L
Sodium: 135 mmol/L — ABNORMAL LOW (ref 136–145)
Total Protein: 6.4 g/dL (ref 6.4–8.2)

## 2011-05-19 LAB — CBC CANCER CENTER
HCT: 32.5 % — ABNORMAL LOW (ref 35.0–47.0)
HGB: 10.9 g/dL — ABNORMAL LOW (ref 12.0–16.0)
Lymphocyte #: 0.5 x10 3/mm — ABNORMAL LOW (ref 1.0–3.6)
Lymphocyte %: 15.6 %
MCHC: 33.6 g/dL (ref 32.0–36.0)
MCV: 83 fL (ref 80–100)
Neutrophil #: 2.3 x10 3/mm (ref 1.4–6.5)
Platelet: 300 x10 3/mm (ref 150–440)
RBC: 3.9 10*6/uL (ref 3.80–5.20)

## 2011-05-22 LAB — CBC CANCER CENTER
Eosinophil #: 0 x10 3/mm (ref 0.0–0.7)
HGB: 10.6 g/dL — ABNORMAL LOW (ref 12.0–16.0)
Lymphocyte #: 0.5 x10 3/mm — ABNORMAL LOW (ref 1.0–3.6)
MCHC: 32.8 g/dL (ref 32.0–36.0)
MCV: 83 fL (ref 80–100)
Monocyte #: 0.8 x10 3/mm (ref 0.2–0.9)
Neutrophil #: 2.9 x10 3/mm (ref 1.4–6.5)

## 2011-05-29 ENCOUNTER — Inpatient Hospital Stay: Payer: Self-pay | Admitting: Oncology

## 2011-05-29 LAB — URINALYSIS, COMPLETE
RBC,UR: 544 /HPF (ref 0–5)
RBC,UR: 22548 /HPF (ref 0–5)
WBC UR: 281 /HPF (ref 0–5)
WBC UR: 319 /HPF (ref 0–5)

## 2011-05-29 LAB — COMPREHENSIVE METABOLIC PANEL
Albumin: 2.5 g/dL — ABNORMAL LOW (ref 3.4–5.0)
Alkaline Phosphatase: 125 U/L (ref 50–136)
Anion Gap: 12 (ref 7–16)
Co2: 26 mmol/L (ref 21–32)
Creatinine: 1.09 mg/dL (ref 0.60–1.30)
EGFR (African American): >60
EGFR (Non-African Amer.): 52 — ABNORMAL LOW
Glucose: 95 mg/dL (ref 65–99)
Osmolality: 280 (ref 275–301)
Total Protein: 6.1 g/dL — ABNORMAL LOW (ref 6.4–8.2)

## 2011-05-29 LAB — CBC CANCER CENTER
Basophil #: 0 x10 3/mm (ref 0.0–0.1)
Eosinophil %: 0 %
HCT: 30.9 % — ABNORMAL LOW (ref 35.0–47.0)
HGB: 10 g/dL — ABNORMAL LOW (ref 12.0–16.0)
Lymphocyte #: 1 x10 3/mm (ref 1.0–3.6)
MCH: 26.6 pg (ref 26.0–34.0)
MCV: 83 fL (ref 80–100)
Monocyte #: 1 x10 3/mm — ABNORMAL HIGH (ref 0.2–0.9)
Monocyte %: 4.4 %
Neutrophil #: 21.5 x10 3/mm — ABNORMAL HIGH (ref 1.4–6.5)
RDW: 21.3 % — ABNORMAL HIGH (ref 11.5–14.5)
WBC: 23.6 x10 3/mm — ABNORMAL HIGH (ref 3.6–11.0)

## 2011-05-30 LAB — CBC WITH DIFFERENTIAL/PLATELET
Basophil #: 0 10*3/uL (ref 0.0–0.1)
Basophil %: 0.2 %
Eosinophil #: 0 10*3/uL (ref 0.0–0.7)
HCT: 29.6 % — ABNORMAL LOW (ref 35.0–47.0)
HGB: 10 g/dL — ABNORMAL LOW (ref 12.0–16.0)
Lymphocyte #: 1.4 10*3/uL (ref 1.0–3.6)
Lymphocyte %: 6.8 %
MCH: 27.8 pg (ref 26.0–34.0)
Monocyte #: 1.4 x10 3/mm — ABNORMAL HIGH (ref 0.2–0.9)
Monocyte %: 6.9 %
Platelet: 453 10*3/uL — ABNORMAL HIGH (ref 150–440)
RBC: 3.59 10*6/uL — ABNORMAL LOW (ref 3.80–5.20)
RDW: 21.4 % — ABNORMAL HIGH (ref 11.5–14.5)
WBC: 20 10*3/uL — ABNORMAL HIGH (ref 3.6–11.0)

## 2011-05-30 LAB — COMPREHENSIVE METABOLIC PANEL
Albumin: 2.3 g/dL — ABNORMAL LOW (ref 3.4–5.0)
Anion Gap: 8 (ref 7–16)
Co2: 26 mmol/L (ref 21–32)
EGFR (Non-African Amer.): 50 — ABNORMAL LOW
Osmolality: 277 (ref 275–301)
Potassium: 3.8 mmol/L (ref 3.5–5.1)
SGOT(AST): 18 U/L (ref 15–37)
SGPT (ALT): 14 U/L
Total Protein: 5.7 g/dL — ABNORMAL LOW (ref 6.4–8.2)

## 2011-05-30 LAB — CANCER ANTIGEN 27.29: CA 27.29: 25.1 U/mL (ref 0.0–38.6)

## 2011-05-31 LAB — COMPREHENSIVE METABOLIC PANEL
Albumin: 1.8 g/dL — ABNORMAL LOW (ref 3.4–5.0)
BUN: 16 mg/dL (ref 7–18)
Bilirubin,Total: 0.4 mg/dL (ref 0.2–1.0)
Calcium, Total: 7.1 mg/dL — ABNORMAL LOW (ref 8.5–10.1)
Creatinine: 0.94 mg/dL (ref 0.60–1.30)
EGFR (African American): >60
EGFR (Non-African Amer.): >60
Glucose: 70 mg/dL (ref 65–99)
Osmolality: 281 (ref 275–301)
Sodium: 141 mmol/L (ref 136–145)
Total Protein: 4.5 g/dL — ABNORMAL LOW (ref 6.4–8.2)

## 2011-05-31 LAB — URINE CULTURE

## 2011-05-31 LAB — CBC WITH DIFFERENTIAL/PLATELET
Eosinophil %: 0 %
Lymphocyte #: 0.9 10*3/uL — ABNORMAL LOW (ref 1.0–3.6)
MCH: 27.1 pg (ref 26.0–34.0)
MCV: 83 fL (ref 80–100)
Monocyte #: 1 x10 3/mm — ABNORMAL HIGH (ref 0.2–0.9)
Neutrophil #: 10.5 10*3/uL — ABNORMAL HIGH (ref 1.4–6.5)
Neutrophil %: 84.9 %
Platelet: 341 10*3/uL (ref 150–440)

## 2011-05-31 LAB — TOBRAMYCIN LEVEL, TROUGH: Tobramycin, Trough: <0.3 ug/mL (ref 0.0–2.0)

## 2011-06-02 LAB — BASIC METABOLIC PANEL
Chloride: 109 mmol/L — ABNORMAL HIGH (ref 98–107)
Co2: 23 mmol/L (ref 21–32)
Creatinine: 0.91 mg/dL (ref 0.60–1.30)
EGFR (African American): >60
EGFR (Non-African Amer.): >60
Glucose: 103 mg/dL — ABNORMAL HIGH (ref 65–99)
Osmolality: 278 (ref 275–301)

## 2011-06-02 LAB — CBC WITH DIFFERENTIAL/PLATELET
Basophil #: 0 10*3/uL (ref 0.0–0.1)
Basophil %: 0.1 %
Lymphocyte #: 0.7 10*3/uL — ABNORMAL LOW (ref 1.0–3.6)
MCH: 27.6 pg (ref 26.0–34.0)
MCV: 85 fL (ref 80–100)
Monocyte #: 1.4 x10 3/mm — ABNORMAL HIGH (ref 0.2–0.9)
Monocyte %: 7 %
Platelet: 339 10*3/uL (ref 150–440)
RDW: 18.4 % — ABNORMAL HIGH (ref 11.5–14.5)
WBC: 20.3 10*3/uL — ABNORMAL HIGH (ref 3.6–11.0)

## 2011-06-02 LAB — MAGNESIUM: Magnesium: 1.7 mg/dL — ABNORMAL LOW

## 2011-06-03 LAB — BASIC METABOLIC PANEL
Calcium, Total: 7.6 mg/dL — ABNORMAL LOW (ref 8.5–10.1)
Chloride: 108 mmol/L — ABNORMAL HIGH (ref 98–107)
Co2: 23 mmol/L (ref 21–32)
Creatinine: 0.82 mg/dL (ref 0.60–1.30)
EGFR (African American): >60
EGFR (Non-African Amer.): >60
Osmolality: 280 (ref 275–301)

## 2011-06-03 LAB — CBC WITH DIFFERENTIAL/PLATELET
Basophil %: 0.1 %
HGB: 10.4 g/dL — ABNORMAL LOW (ref 12.0–16.0)
Lymphocyte #: 1 10*3/uL (ref 1.0–3.6)
MCH: 27.2 pg (ref 26.0–34.0)
MCHC: 31.9 g/dL — ABNORMAL LOW (ref 32.0–36.0)
MCV: 85 fL (ref 80–100)
Monocyte #: 1.6 x10 3/mm — ABNORMAL HIGH (ref 0.2–0.9)
Monocyte %: 8.2 %
Neutrophil #: 16.6 10*3/uL — ABNORMAL HIGH (ref 1.4–6.5)
Neutrophil %: 86.5 %
RDW: 18.7 % — ABNORMAL HIGH (ref 11.5–14.5)
WBC: 19.2 10*3/uL — ABNORMAL HIGH (ref 3.6–11.0)

## 2011-06-04 LAB — CULTURE, BLOOD (SINGLE)

## 2011-06-04 LAB — CBC WITH DIFFERENTIAL/PLATELET
Basophil #: 0.1 10*3/uL (ref 0.0–0.1)
Basophil %: 0.4 %
Eosinophil %: 0.1 %
HGB: 10.5 g/dL — ABNORMAL LOW (ref 12.0–16.0)
MCH: 28 pg (ref 26.0–34.0)
MCHC: 33 g/dL (ref 32.0–36.0)
Monocyte #: 1.2 x10 3/mm — ABNORMAL HIGH (ref 0.2–0.9)
Neutrophil #: 13.5 10*3/uL — ABNORMAL HIGH (ref 1.4–6.5)
RDW: 18.3 % — ABNORMAL HIGH (ref 11.5–14.5)
WBC: 15.6 10*3/uL — ABNORMAL HIGH (ref 3.6–11.0)

## 2011-06-04 LAB — CALCIUM: Calcium, Total: 8.6 mg/dL (ref 8.5–10.1)

## 2011-06-04 LAB — POTASSIUM: Potassium: 3.4 mmol/L — ABNORMAL LOW (ref 3.5–5.1)

## 2011-06-05 LAB — BASIC METABOLIC PANEL
Chloride: 102 mmol/L (ref 98–107)
EGFR (African American): >60
Potassium: 3.2 mmol/L — ABNORMAL LOW (ref 3.5–5.1)
Sodium: 139 mmol/L (ref 136–145)

## 2011-06-05 LAB — PATHOLOGY REPORT

## 2011-06-05 LAB — PRO B NATRIURETIC PEPTIDE: B-Type Natriuretic Peptide: 2612 pg/mL — ABNORMAL HIGH (ref 0–125)

## 2011-06-06 ENCOUNTER — Encounter: Payer: Self-pay | Admitting: Internal Medicine

## 2011-06-06 LAB — CBC WITH DIFFERENTIAL/PLATELET
Basophil #: 0.1 10*3/uL (ref 0.0–0.1)
Eosinophil #: 0 10*3/uL (ref 0.0–0.7)
Eosinophil %: 0.4 %
Lymphocyte %: 10 %
MCH: 28 pg (ref 26.0–34.0)
MCHC: 32.7 g/dL (ref 32.0–36.0)
MCV: 86 fL (ref 80–100)
Monocyte #: 0.8 x10 3/mm (ref 0.2–0.9)
Monocyte %: 8.2 %
Neutrophil #: 7.4 10*3/uL — ABNORMAL HIGH (ref 1.4–6.5)
Neutrophil %: 80.7 %
Platelet: 364 10*3/uL (ref 150–440)
RBC: 3.42 10*6/uL — ABNORMAL LOW (ref 3.80–5.20)

## 2011-06-06 LAB — BASIC METABOLIC PANEL
Anion Gap: 5 — ABNORMAL LOW (ref 7–16)
BUN: 9 mg/dL (ref 7–18)
Co2: 34 mmol/L — ABNORMAL HIGH (ref 21–32)
Creatinine: 0.78 mg/dL (ref 0.60–1.30)

## 2011-06-07 LAB — BASIC METABOLIC PANEL
BUN: 10 mg/dL (ref 7–18)
Calcium, Total: 8.5 mg/dL (ref 8.5–10.1)
Chloride: 97 mmol/L — ABNORMAL LOW (ref 98–107)
Co2: 35 mmol/L — ABNORMAL HIGH (ref 21–32)
Creatinine: 0.78 mg/dL (ref 0.60–1.30)
EGFR (African American): >60
EGFR (Non-African Amer.): >60
Glucose: 74 mg/dL (ref 65–99)
Potassium: 3.7 mmol/L (ref 3.5–5.1)
Sodium: 138 mmol/L (ref 136–145)

## 2011-06-10 ENCOUNTER — Ambulatory Visit: Payer: Self-pay | Admitting: Oncology

## 2011-06-13 ENCOUNTER — Ambulatory Visit: Payer: Self-pay | Admitting: Urology

## 2011-06-14 ENCOUNTER — Ambulatory Visit: Payer: Self-pay | Admitting: Oncology

## 2011-07-10 ENCOUNTER — Ambulatory Visit: Payer: Self-pay | Admitting: Oncology

## 2011-07-10 LAB — CBC CANCER CENTER
Basophil #: 0.1 x10 3/mm (ref 0.0–0.1)
Eosinophil #: 0.2 x10 3/mm (ref 0.0–0.7)
HCT: 35.8 % (ref 35.0–47.0)
Lymphocyte #: 0.8 x10 3/mm — ABNORMAL LOW (ref 1.0–3.6)
Lymphocyte %: 10.8 %
MCV: 88 fL (ref 80–100)
Neutrophil #: 6.2 x10 3/mm (ref 1.4–6.5)
Platelet: 270 x10 3/mm (ref 150–440)
RBC: 4.06 10*6/uL (ref 3.80–5.20)
RDW: 15.9 % — ABNORMAL HIGH (ref 11.5–14.5)
WBC: 7.6 x10 3/mm (ref 3.6–11.0)

## 2011-07-10 LAB — COMPREHENSIVE METABOLIC PANEL
Albumin: 3.1 g/dL — ABNORMAL LOW (ref 3.4–5.0)
Anion Gap: 8 (ref 7–16)
Bilirubin,Total: 0.9 mg/dL (ref 0.2–1.0)
Calcium, Total: 9.1 mg/dL (ref 8.5–10.1)
Chloride: 104 mmol/L (ref 98–107)
EGFR (African American): >60
Glucose: 123 mg/dL — ABNORMAL HIGH (ref 65–99)
Potassium: 3.7 mmol/L (ref 3.5–5.1)
SGPT (ALT): 13 U/L
Sodium: 140 mmol/L (ref 136–145)
Total Protein: 6.2 g/dL — ABNORMAL LOW (ref 6.4–8.2)

## 2011-07-17 LAB — COMPREHENSIVE METABOLIC PANEL
Anion Gap: 7 (ref 7–16)
Calcium, Total: 8.5 mg/dL (ref 8.5–10.1)
Co2: 30 mmol/L (ref 21–32)
Creatinine: 1.16 mg/dL (ref 0.60–1.30)
EGFR (African American): 56 — ABNORMAL LOW
EGFR (Non-African Amer.): 49 — ABNORMAL LOW
Glucose: 122 mg/dL — ABNORMAL HIGH (ref 65–99)
Osmolality: 280 (ref 275–301)
Potassium: 3.5 mmol/L (ref 3.5–5.1)
SGOT(AST): 14 U/L — ABNORMAL LOW (ref 15–37)
SGPT (ALT): 19 U/L
Sodium: 140 mmol/L (ref 136–145)

## 2011-07-17 LAB — CBC CANCER CENTER
Basophil #: 0 x10 3/mm (ref 0.0–0.1)
Basophil %: 1.1 %
HCT: 31 % — ABNORMAL LOW (ref 35.0–47.0)
Lymphocyte #: 0.7 x10 3/mm — ABNORMAL LOW (ref 1.0–3.6)
Lymphocyte %: 20.1 %
MCHC: 33.2 g/dL (ref 32.0–36.0)
MCV: 87 fL (ref 80–100)
Monocyte %: 5 %
Neutrophil #: 2.5 x10 3/mm (ref 1.4–6.5)
RBC: 3.58 10*6/uL — ABNORMAL LOW (ref 3.80–5.20)
RDW: 15.9 % — ABNORMAL HIGH (ref 11.5–14.5)
WBC: 3.5 x10 3/mm — ABNORMAL LOW (ref 3.6–11.0)

## 2011-07-31 LAB — COMPREHENSIVE METABOLIC PANEL
Albumin: 2.9 g/dL — ABNORMAL LOW (ref 3.4–5.0)
Alkaline Phosphatase: 105 U/L (ref 50–136)
Anion Gap: 8 (ref 7–16)
BUN: 10 mg/dL (ref 7–18)
Glucose: 143 mg/dL — ABNORMAL HIGH (ref 65–99)
Osmolality: 283 (ref 275–301)
Potassium: 3.4 mmol/L — ABNORMAL LOW (ref 3.5–5.1)
SGOT(AST): 12 U/L — ABNORMAL LOW (ref 15–37)
Sodium: 141 mmol/L (ref 136–145)
Total Protein: 5.9 g/dL — ABNORMAL LOW (ref 6.4–8.2)

## 2011-07-31 LAB — CBC CANCER CENTER
Basophil %: 0.5 %
Eosinophil #: 0 x10 3/mm (ref 0.0–0.7)
Eosinophil %: 0.1 %
HGB: 10.6 g/dL — ABNORMAL LOW (ref 12.0–16.0)
Lymphocyte #: 0.9 x10 3/mm — ABNORMAL LOW (ref 1.0–3.6)
MCH: 29 pg (ref 26.0–34.0)
MCHC: 32.8 g/dL (ref 32.0–36.0)
Monocyte %: 6.1 %
Neutrophil #: 8.1 x10 3/mm — ABNORMAL HIGH (ref 1.4–6.5)
Neutrophil %: 83.9 %
Platelet: 327 x10 3/mm (ref 150–440)
RBC: 3.64 10*6/uL — ABNORMAL LOW (ref 3.80–5.20)
WBC: 9.6 x10 3/mm (ref 3.6–11.0)

## 2011-08-07 LAB — CBC CANCER CENTER
Basophil #: 0 x10 3/mm (ref 0.0–0.1)
Eosinophil %: 0.1 %
HCT: 31 % — ABNORMAL LOW (ref 35.0–47.0)
HGB: 10.6 g/dL — ABNORMAL LOW (ref 12.0–16.0)
Lymphocyte #: 0.7 x10 3/mm — ABNORMAL LOW (ref 1.0–3.6)
Lymphocyte %: 18.1 %
MCH: 30.3 pg (ref 26.0–34.0)
Monocyte %: 5.4 %
Neutrophil %: 75.2 %
Platelet: 342 x10 3/mm (ref 150–440)
RBC: 3.51 10*6/uL — ABNORMAL LOW (ref 3.80–5.20)
RDW: 16.5 % — ABNORMAL HIGH (ref 11.5–14.5)

## 2011-08-07 LAB — COMPREHENSIVE METABOLIC PANEL
Alkaline Phosphatase: 89 U/L (ref 50–136)
Bilirubin,Total: 0.8 mg/dL (ref 0.2–1.0)
Calcium, Total: 8.7 mg/dL (ref 8.5–10.1)
Chloride: 104 mmol/L (ref 98–107)
Co2: 29 mmol/L (ref 21–32)
Creatinine: 1.15 mg/dL (ref 0.60–1.30)
EGFR (African American): 57 — ABNORMAL LOW
EGFR (Non-African Amer.): 49 — ABNORMAL LOW
SGOT(AST): 11 U/L — ABNORMAL LOW (ref 15–37)
SGPT (ALT): 13 U/L

## 2011-08-10 ENCOUNTER — Ambulatory Visit: Payer: Self-pay | Admitting: Oncology

## 2011-08-21 LAB — CBC CANCER CENTER
Basophil #: 0 x10 3/mm (ref 0.0–0.1)
Eosinophil #: 0 x10 3/mm (ref 0.0–0.7)
Eosinophil %: 0 %
HGB: 11 g/dL — ABNORMAL LOW (ref 12.0–16.0)
Lymphocyte #: 1 x10 3/mm (ref 1.0–3.6)
Lymphocyte %: 16.6 %
MCH: 29.8 pg (ref 26.0–34.0)
MCHC: 33.2 g/dL (ref 32.0–36.0)
Monocyte #: 0.5 x10 3/mm (ref 0.2–0.9)
Neutrophil %: 74.1 %
Platelet: 319 x10 3/mm (ref 150–440)
RBC: 3.69 10*6/uL — ABNORMAL LOW (ref 3.80–5.20)
RDW: 17.6 % — ABNORMAL HIGH (ref 11.5–14.5)

## 2011-08-21 LAB — COMPREHENSIVE METABOLIC PANEL
Albumin: 3 g/dL — ABNORMAL LOW (ref 3.4–5.0)
Alkaline Phosphatase: 93 U/L (ref 50–136)
Anion Gap: 8 (ref 7–16)
BUN: 10 mg/dL (ref 7–18)
Bilirubin,Total: 0.7 mg/dL (ref 0.2–1.0)
Chloride: 104 mmol/L (ref 98–107)
Co2: 27 mmol/L (ref 21–32)
EGFR (Non-African Amer.): 57 — ABNORMAL LOW
SGOT(AST): 16 U/L (ref 15–37)
SGPT (ALT): 12 U/L (ref 12–78)
Total Protein: 5.8 g/dL — ABNORMAL LOW (ref 6.4–8.2)

## 2011-08-22 LAB — CANCER ANTIGEN 27.29: CA 27.29: 24.5 U/mL (ref 0.0–38.6)

## 2011-08-28 LAB — CBC CANCER CENTER
Basophil #: 0 x10 3/mm (ref 0.0–0.1)
Eosinophil #: 0 x10 3/mm (ref 0.0–0.7)
HCT: 30.8 % — ABNORMAL LOW (ref 35.0–47.0)
Lymphocyte #: 0.7 x10 3/mm — ABNORMAL LOW (ref 1.0–3.6)
MCHC: 32.9 g/dL (ref 32.0–36.0)
MCV: 90 fL (ref 80–100)
Neutrophil #: 2.8 x10 3/mm (ref 1.4–6.5)
RBC: 3.43 10*6/uL — ABNORMAL LOW (ref 3.80–5.20)
RDW: 17.4 % — ABNORMAL HIGH (ref 11.5–14.5)
WBC: 3.7 x10 3/mm (ref 3.6–11.0)

## 2011-08-28 LAB — COMPREHENSIVE METABOLIC PANEL
Albumin: 3.2 g/dL — ABNORMAL LOW (ref 3.4–5.0)
Alkaline Phosphatase: 86 U/L (ref 50–136)
Anion Gap: 8 (ref 7–16)
BUN: 9 mg/dL (ref 7–18)
Bilirubin,Total: 0.6 mg/dL (ref 0.2–1.0)
Creatinine: 0.93 mg/dL (ref 0.60–1.30)
EGFR (Non-African Amer.): >60
Glucose: 126 mg/dL — ABNORMAL HIGH (ref 65–99)
Osmolality: 281 (ref 275–301)
SGPT (ALT): 14 U/L (ref 12–78)
Sodium: 141 mmol/L (ref 136–145)
Total Protein: 6 g/dL — ABNORMAL LOW (ref 6.4–8.2)

## 2011-09-10 ENCOUNTER — Ambulatory Visit: Payer: Self-pay | Admitting: Oncology

## 2011-09-12 LAB — CBC CANCER CENTER
Basophil #: 0 x10 3/mm (ref 0.0–0.1)
Eosinophil %: 0 %
HGB: 11.3 g/dL — ABNORMAL LOW (ref 12.0–16.0)
MCH: 30 pg (ref 26.0–34.0)
MCHC: 32.6 g/dL (ref 32.0–36.0)
MCV: 92 fL (ref 80–100)
Monocyte %: 8.4 %
Neutrophil %: 77.1 %
RBC: 3.77 10*6/uL — ABNORMAL LOW (ref 3.80–5.20)

## 2011-09-12 LAB — COMPREHENSIVE METABOLIC PANEL
Albumin: 3.2 g/dL — ABNORMAL LOW (ref 3.4–5.0)
Alkaline Phosphatase: 98 U/L (ref 50–136)
Anion Gap: 8 (ref 7–16)
Bilirubin,Total: 0.8 mg/dL (ref 0.2–1.0)
Co2: 26 mmol/L (ref 21–32)
Creatinine: 1 mg/dL (ref 0.60–1.30)
EGFR (African American): >60
EGFR (Non-African Amer.): 58 — ABNORMAL LOW
Osmolality: 277 (ref 275–301)
Potassium: 3.8 mmol/L (ref 3.5–5.1)
Sodium: 139 mmol/L (ref 136–145)

## 2011-09-19 LAB — COMPREHENSIVE METABOLIC PANEL
Albumin: 3.2 g/dL — ABNORMAL LOW (ref 3.4–5.0)
Alkaline Phosphatase: 72 U/L (ref 50–136)
Anion Gap: 7 (ref 7–16)
BUN: 9 mg/dL (ref 7–18)
Calcium, Total: 8.8 mg/dL (ref 8.5–10.1)
Co2: 28 mmol/L (ref 21–32)
EGFR (Non-African Amer.): >60
Glucose: 134 mg/dL — ABNORMAL HIGH (ref 65–99)
Potassium: 3.3 mmol/L — ABNORMAL LOW (ref 3.5–5.1)
SGOT(AST): 18 U/L (ref 15–37)
Total Protein: 5.8 g/dL — ABNORMAL LOW (ref 6.4–8.2)

## 2011-09-19 LAB — CBC CANCER CENTER
Basophil #: 0 x10 3/mm (ref 0.0–0.1)
Eosinophil #: 0 x10 3/mm (ref 0.0–0.7)
HCT: 32.2 % — ABNORMAL LOW (ref 35.0–47.0)
HGB: 11 g/dL — ABNORMAL LOW (ref 12.0–16.0)
Lymphocyte #: 0.7 x10 3/mm — ABNORMAL LOW (ref 1.0–3.6)
MCH: 31 pg (ref 26.0–34.0)
MCV: 91 fL (ref 80–100)
Monocyte %: 5.1 %
Neutrophil #: 2.9 x10 3/mm (ref 1.4–6.5)
Neutrophil %: 75.6 %
Platelet: 271 x10 3/mm (ref 150–440)
RBC: 3.56 10*6/uL — ABNORMAL LOW (ref 3.80–5.20)
RDW: 18 % — ABNORMAL HIGH (ref 11.5–14.5)
WBC: 3.8 x10 3/mm (ref 3.6–11.0)

## 2011-10-03 LAB — COMPREHENSIVE METABOLIC PANEL
Alkaline Phosphatase: 85 U/L (ref 50–136)
Bilirubin,Total: 0.8 mg/dL (ref 0.2–1.0)
Calcium, Total: 9.5 mg/dL (ref 8.5–10.1)
Co2: 29 mmol/L (ref 21–32)
Creatinine: 1.21 mg/dL (ref 0.60–1.30)
EGFR (Non-African Amer.): 46 — ABNORMAL LOW
Glucose: 93 mg/dL (ref 65–99)
SGPT (ALT): 16 U/L (ref 12–78)
Total Protein: 6.2 g/dL — ABNORMAL LOW (ref 6.4–8.2)

## 2011-10-03 LAB — CBC CANCER CENTER
Basophil #: 0 x10 3/mm (ref 0.0–0.1)
Eosinophil #: 0 x10 3/mm (ref 0.0–0.7)
HCT: 35.7 % (ref 35.0–47.0)
Lymphocyte #: 1 x10 3/mm (ref 1.0–3.6)
Lymphocyte %: 13.3 %
MCH: 29.7 pg (ref 26.0–34.0)
MCV: 92 fL (ref 80–100)
Monocyte %: 9 %
Neutrophil #: 5.7 x10 3/mm (ref 1.4–6.5)
Platelet: 315 x10 3/mm (ref 150–440)
RBC: 3.9 10*6/uL (ref 3.80–5.20)
WBC: 7.5 x10 3/mm (ref 3.6–11.0)

## 2011-10-10 ENCOUNTER — Ambulatory Visit: Payer: Self-pay | Admitting: Oncology

## 2011-10-10 LAB — CBC CANCER CENTER
Basophil #: 0 x10 3/mm (ref 0.0–0.1)
Basophil %: 1.2 %
Eosinophil %: 0 %
HCT: 34 % — ABNORMAL LOW (ref 35.0–47.0)
HGB: 11.3 g/dL — ABNORMAL LOW (ref 12.0–16.0)
Lymphocyte %: 20.2 %
Monocyte #: 0.2 x10 3/mm (ref 0.2–0.9)
Monocyte %: 4.9 %
Neutrophil #: 3 x10 3/mm (ref 1.4–6.5)
RBC: 3.77 10*6/uL — ABNORMAL LOW (ref 3.80–5.20)

## 2011-10-10 LAB — BASIC METABOLIC PANEL
BUN: 10 mg/dL (ref 7–18)
Chloride: 104 mmol/L (ref 98–107)
Co2: 25 mmol/L (ref 21–32)
Creatinine: 0.85 mg/dL (ref 0.60–1.30)
EGFR (African American): >60
Potassium: 3.6 mmol/L (ref 3.5–5.1)

## 2011-10-24 LAB — BASIC METABOLIC PANEL
BUN: 12 mg/dL (ref 7–18)
Chloride: 103 mmol/L (ref 98–107)
Creatinine: 1.15 mg/dL (ref 0.60–1.30)
EGFR (African American): 57 — ABNORMAL LOW
EGFR (Non-African Amer.): 49 — ABNORMAL LOW
Glucose: 94 mg/dL (ref 65–99)
Osmolality: 277 (ref 275–301)
Potassium: 4.3 mmol/L (ref 3.5–5.1)
Sodium: 139 mmol/L (ref 136–145)

## 2011-10-24 LAB — CBC CANCER CENTER
Basophil %: 0.5 %
Eosinophil #: 0 x10 3/mm (ref 0.0–0.7)
HCT: 34.9 % — ABNORMAL LOW (ref 35.0–47.0)
Lymphocyte %: 12.6 %
MCH: 30.4 pg (ref 26.0–34.0)
MCHC: 33.4 g/dL (ref 32.0–36.0)
MCV: 91 fL (ref 80–100)
Neutrophil #: 5.2 x10 3/mm (ref 1.4–6.5)
RBC: 3.82 10*6/uL (ref 3.80–5.20)
RDW: 17.3 % — ABNORMAL HIGH (ref 11.5–14.5)

## 2011-11-07 LAB — COMPREHENSIVE METABOLIC PANEL
Alkaline Phosphatase: 92 U/L (ref 50–136)
Anion Gap: 9 (ref 7–16)
BUN: 11 mg/dL (ref 7–18)
Bilirubin,Total: 0.3 mg/dL (ref 0.2–1.0)
Calcium, Total: 8.4 mg/dL — ABNORMAL LOW (ref 8.5–10.1)
Chloride: 103 mmol/L (ref 98–107)
Co2: 28 mmol/L (ref 21–32)
EGFR (African American): >60
Osmolality: 279 (ref 275–301)
Potassium: 3.7 mmol/L (ref 3.5–5.1)
Sodium: 140 mmol/L (ref 136–145)
Total Protein: 6 g/dL — ABNORMAL LOW (ref 6.4–8.2)

## 2011-11-07 LAB — CBC CANCER CENTER
Basophil #: 0 x10 3/mm (ref 0.0–0.1)
Eosinophil #: 0.1 x10 3/mm (ref 0.0–0.7)
HCT: 35 % (ref 35.0–47.0)
Lymphocyte %: 17.6 %
MCH: 29.8 pg (ref 26.0–34.0)
MCHC: 32.2 g/dL (ref 32.0–36.0)
MCV: 92 fL (ref 80–100)
Monocyte #: 0.6 x10 3/mm (ref 0.2–0.9)
Monocyte %: 14.6 %
Neutrophil #: 2.5 x10 3/mm (ref 1.4–6.5)
Platelet: 303 x10 3/mm (ref 150–440)
RDW: 16.1 % — ABNORMAL HIGH (ref 11.5–14.5)
WBC: 3.9 x10 3/mm (ref 3.6–11.0)

## 2011-11-10 ENCOUNTER — Ambulatory Visit: Payer: Self-pay | Admitting: Oncology

## 2011-11-14 LAB — COMPREHENSIVE METABOLIC PANEL
Albumin: 3.3 g/dL — ABNORMAL LOW (ref 3.4–5.0)
Anion Gap: 9 (ref 7–16)
BUN: 12 mg/dL (ref 7–18)
Calcium, Total: 9.1 mg/dL (ref 8.5–10.1)
Chloride: 100 mmol/L (ref 98–107)
EGFR (African American): >60
EGFR (Non-African Amer.): >60
Glucose: 105 mg/dL — ABNORMAL HIGH (ref 65–99)
Potassium: 3.9 mmol/L (ref 3.5–5.1)
SGOT(AST): 13 U/L — ABNORMAL LOW (ref 15–37)
SGPT (ALT): 16 U/L (ref 12–78)
Total Protein: 6.2 g/dL — ABNORMAL LOW (ref 6.4–8.2)

## 2011-11-14 LAB — CBC CANCER CENTER
Basophil #: 0 x10 3/mm (ref 0.0–0.1)
Basophil %: 1.3 %
Eosinophil #: 0 x10 3/mm (ref 0.0–0.7)
HCT: 35.9 % (ref 35.0–47.0)
HGB: 11.7 g/dL — ABNORMAL LOW (ref 12.0–16.0)
Lymphocyte %: 20.3 %
MCHC: 32.6 g/dL (ref 32.0–36.0)
Monocyte #: 0.2 x10 3/mm (ref 0.2–0.9)
Monocyte %: 4.7 %
Neutrophil #: 2.5 x10 3/mm (ref 1.4–6.5)
Neutrophil %: 73.5 %
Platelet: 306 x10 3/mm (ref 150–440)
WBC: 3.4 x10 3/mm — ABNORMAL LOW (ref 3.6–11.0)

## 2011-11-28 LAB — COMPREHENSIVE METABOLIC PANEL
Albumin: 3.1 g/dL — ABNORMAL LOW (ref 3.4–5.0)
Anion Gap: 7 (ref 7–16)
BUN: 11 mg/dL (ref 7–18)
Calcium, Total: 8.5 mg/dL (ref 8.5–10.1)
Co2: 28 mmol/L (ref 21–32)
EGFR (African American): >60
EGFR (Non-African Amer.): >60
Glucose: 85 mg/dL (ref 65–99)
Potassium: 4.1 mmol/L (ref 3.5–5.1)
SGOT(AST): 14 U/L — ABNORMAL LOW (ref 15–37)
SGPT (ALT): 15 U/L (ref 12–78)
Sodium: 139 mmol/L (ref 136–145)
Total Protein: 5.7 g/dL — ABNORMAL LOW (ref 6.4–8.2)

## 2011-11-28 LAB — CBC CANCER CENTER
Basophil #: 0.1 x10 3/mm (ref 0.0–0.1)
Basophil %: 0.8 %
Eosinophil #: 0 x10 3/mm (ref 0.0–0.7)
HCT: 35 % (ref 35.0–47.0)
Lymphocyte #: 0.8 x10 3/mm — ABNORMAL LOW (ref 1.0–3.6)
Lymphocyte %: 12.2 %
MCH: 29.8 pg (ref 26.0–34.0)
MCHC: 32.8 g/dL (ref 32.0–36.0)
Monocyte #: 0.6 x10 3/mm (ref 0.2–0.9)
Neutrophil #: 4.9 x10 3/mm (ref 1.4–6.5)
RDW: 16.3 % — ABNORMAL HIGH (ref 11.5–14.5)

## 2011-11-29 LAB — CANCER ANTIGEN 27.29: CA 27.29: 41.1 U/mL — ABNORMAL HIGH (ref 0.0–38.6)

## 2011-12-05 LAB — CBC CANCER CENTER
Basophil #: 0 x10 3/mm (ref 0.0–0.1)
Basophil %: 0.6 %
Eosinophil #: 0 x10 3/mm (ref 0.0–0.7)
Eosinophil %: 0.1 %
HCT: 33.1 % — ABNORMAL LOW (ref 35.0–47.0)
Lymphocyte %: 9.7 %
MCH: 30.3 pg (ref 26.0–34.0)
MCHC: 34 g/dL (ref 32.0–36.0)
Monocyte #: 0.2 x10 3/mm (ref 0.2–0.9)
Monocyte %: 4.2 %
Neutrophil #: 5 x10 3/mm (ref 1.4–6.5)
Neutrophil %: 85.4 %
Platelet: 286 x10 3/mm (ref 150–440)
RDW: 15.6 % — ABNORMAL HIGH (ref 11.5–14.5)

## 2011-12-10 ENCOUNTER — Ambulatory Visit: Payer: Self-pay | Admitting: Oncology

## 2011-12-12 LAB — CBC CANCER CENTER
Basophil %: 1.4 %
Eosinophil #: 0 x10 3/mm (ref 0.0–0.7)
Eosinophil %: 0.1 %
HCT: 33.1 % — ABNORMAL LOW (ref 35.0–47.0)
Lymphocyte %: 41.4 %
MCHC: 33.7 g/dL (ref 32.0–36.0)
Monocyte %: 7.9 %
Neutrophil #: 0.8 x10 3/mm — ABNORMAL LOW (ref 1.4–6.5)
Neutrophil %: 49.2 %
Platelet: 319 x10 3/mm (ref 150–440)
RBC: 3.74 10*6/uL — ABNORMAL LOW (ref 3.80–5.20)
WBC: 1.5 x10 3/mm — CL (ref 3.6–11.0)

## 2011-12-19 LAB — CBC CANCER CENTER
Basophil #: 0 x10 3/mm (ref 0.0–0.1)
Basophil %: 1.1 %
Eosinophil %: 0.2 %
HGB: 12 g/dL (ref 12.0–16.0)
Lymphocyte %: 19.3 %
MCH: 30.5 pg (ref 26.0–34.0)
MCV: 89 fL (ref 80–100)
Monocyte #: 0.6 x10 3/mm (ref 0.2–0.9)
Neutrophil #: 2.7 x10 3/mm (ref 1.4–6.5)
Neutrophil %: 64.5 %
Platelet: 304 x10 3/mm (ref 150–440)
RBC: 3.94 10*6/uL (ref 3.80–5.20)
WBC: 4.3 x10 3/mm (ref 3.6–11.0)

## 2011-12-19 LAB — COMPREHENSIVE METABOLIC PANEL
Albumin: 3.1 g/dL — ABNORMAL LOW (ref 3.4–5.0)
Alkaline Phosphatase: 80 U/L (ref 50–136)
Anion Gap: 6 — ABNORMAL LOW (ref 7–16)
BUN: 12 mg/dL (ref 7–18)
Bilirubin,Total: 0.8 mg/dL (ref 0.2–1.0)
Calcium, Total: 8.5 mg/dL (ref 8.5–10.1)
Chloride: 104 mmol/L (ref 98–107)
Co2: 30 mmol/L (ref 21–32)
Creatinine: 1 mg/dL (ref 0.60–1.30)
EGFR (Non-African Amer.): 58 — ABNORMAL LOW
Glucose: 104 mg/dL — ABNORMAL HIGH (ref 65–99)
Potassium: 3.7 mmol/L (ref 3.5–5.1)
SGOT(AST): 16 U/L (ref 15–37)
SGPT (ALT): 16 U/L (ref 12–78)
Total Protein: 6 g/dL — ABNORMAL LOW (ref 6.4–8.2)

## 2011-12-20 LAB — CANCER ANTIGEN 27.29: CA 27.29: 46.1 U/mL — ABNORMAL HIGH (ref 0.0–38.6)

## 2011-12-26 LAB — CBC CANCER CENTER
Basophil #: 0.1 x10 3/mm (ref 0.0–0.1)
Eosinophil #: 0 x10 3/mm (ref 0.0–0.7)
HCT: 33.7 % — ABNORMAL LOW (ref 35.0–47.0)
Lymphocyte %: 16.3 %
MCH: 30.3 pg (ref 26.0–34.0)
MCHC: 34.5 g/dL (ref 32.0–36.0)
Neutrophil #: 3.9 x10 3/mm (ref 1.4–6.5)
Platelet: 243 x10 3/mm (ref 150–440)
RDW: 15.8 % — ABNORMAL HIGH (ref 11.5–14.5)
WBC: 5 x10 3/mm (ref 3.6–11.0)

## 2012-01-10 ENCOUNTER — Ambulatory Visit: Payer: Self-pay | Admitting: Oncology

## 2012-01-16 LAB — CBC CANCER CENTER
Eosinophil #: 0 x10 3/mm (ref 0.0–0.7)
HCT: 37.1 % (ref 35.0–47.0)
HGB: 12.4 g/dL (ref 12.0–16.0)
Lymphocyte #: 0.8 x10 3/mm — ABNORMAL LOW (ref 1.0–3.6)
MCH: 30 pg (ref 26.0–34.0)
MCHC: 33.4 g/dL (ref 32.0–36.0)
MCV: 90 fL (ref 80–100)
Monocyte #: 0.4 x10 3/mm (ref 0.2–0.9)
Monocyte %: 5.3 %
Neutrophil %: 82.6 %
Platelet: 246 x10 3/mm (ref 150–440)
RBC: 4.14 10*6/uL (ref 3.80–5.20)
RDW: 16.5 % — ABNORMAL HIGH (ref 11.5–14.5)
WBC: 7.5 x10 3/mm (ref 3.6–11.0)

## 2012-01-16 LAB — COMPREHENSIVE METABOLIC PANEL
Alkaline Phosphatase: 79 U/L (ref 50–136)
BUN: 16 mg/dL (ref 7–18)
Bilirubin,Total: 0.9 mg/dL (ref 0.2–1.0)
Calcium, Total: 8.8 mg/dL (ref 8.5–10.1)
Co2: 28 mmol/L (ref 21–32)
Creatinine: 1.06 mg/dL (ref 0.60–1.30)
EGFR (Non-African Amer.): 54 — ABNORMAL LOW
Glucose: 116 mg/dL — ABNORMAL HIGH (ref 65–99)
Osmolality: 282 (ref 275–301)
SGPT (ALT): 15 U/L (ref 12–78)
Sodium: 140 mmol/L (ref 136–145)
Total Protein: 6.2 g/dL — ABNORMAL LOW (ref 6.4–8.2)

## 2012-01-23 LAB — CBC CANCER CENTER
Basophil %: 1.3 %
HCT: 38.5 % (ref 35.0–47.0)
HGB: 13 g/dL (ref 12.0–16.0)
Lymphocyte %: 24.6 %
MCHC: 33.7 g/dL (ref 32.0–36.0)
MCV: 89 fL (ref 80–100)
Monocyte #: 0.2 x10 3/mm (ref 0.2–0.9)
Neutrophil #: 2.5 x10 3/mm (ref 1.4–6.5)
Neutrophil %: 68.1 %
WBC: 3.7 x10 3/mm (ref 3.6–11.0)

## 2012-01-24 LAB — CANCER ANTIGEN 27.29: CA 27.29: 61.4 U/mL — ABNORMAL HIGH (ref 0.0–38.6)

## 2012-01-30 LAB — CBC CANCER CENTER
Basophil #: 0 x10 3/mm (ref 0.0–0.1)
Basophil %: 1.2 %
Eosinophil %: 2.2 %
HCT: 35 % (ref 35.0–47.0)
Lymphocyte #: 0.7 x10 3/mm — ABNORMAL LOW (ref 1.0–3.6)
Lymphocyte %: 22 %
MCH: 30.5 pg (ref 26.0–34.0)
Monocyte #: 0.5 x10 3/mm (ref 0.2–0.9)
Neutrophil %: 57.5 %
Platelet: 258 x10 3/mm (ref 150–440)
RBC: 3.93 10*6/uL (ref 3.80–5.20)
RDW: 16.7 % — ABNORMAL HIGH (ref 11.5–14.5)

## 2012-01-30 LAB — COMPREHENSIVE METABOLIC PANEL
Albumin: 3.3 g/dL — ABNORMAL LOW (ref 3.4–5.0)
Bilirubin,Total: 0.9 mg/dL (ref 0.2–1.0)
Calcium, Total: 8.2 mg/dL — ABNORMAL LOW (ref 8.5–10.1)
Co2: 31 mmol/L (ref 21–32)
Creatinine: 1.08 mg/dL (ref 0.60–1.30)
EGFR (African American): >60
Osmolality: 281 (ref 275–301)
SGOT(AST): 13 U/L — ABNORMAL LOW (ref 15–37)
SGPT (ALT): 12 U/L (ref 12–78)
Total Protein: 5.9 g/dL — ABNORMAL LOW (ref 6.4–8.2)

## 2012-02-10 ENCOUNTER — Ambulatory Visit: Payer: Self-pay | Admitting: Oncology

## 2012-02-19 LAB — CREATININE, SERUM: EGFR (Non-African Amer.): >60

## 2012-03-09 ENCOUNTER — Ambulatory Visit: Payer: Self-pay | Admitting: Oncology

## 2012-03-12 LAB — CBC CANCER CENTER
Basophil #: 0 x10 3/mm (ref 0.0–0.1)
Basophil %: 0.1 %
Eosinophil #: 0 x10 3/mm (ref 0.0–0.7)
Eosinophil %: 0 %
HGB: 12.9 g/dL (ref 12.0–16.0)
MCH: 30.1 pg (ref 26.0–34.0)
Monocyte %: 1.1 %
Neutrophil %: 96.4 %
RBC: 4.31 10*6/uL (ref 3.80–5.20)
RDW: 16 % — ABNORMAL HIGH (ref 11.5–14.5)

## 2012-03-12 LAB — COMPREHENSIVE METABOLIC PANEL
Alkaline Phosphatase: 70 U/L (ref 50–136)
Anion Gap: 10 (ref 7–16)
BUN: 16 mg/dL (ref 7–18)
Bilirubin,Total: 0.4 mg/dL (ref 0.2–1.0)
Calcium, Total: 8.4 mg/dL — ABNORMAL LOW (ref 8.5–10.1)
Co2: 27 mmol/L (ref 21–32)
EGFR (African American): 58 — ABNORMAL LOW
Osmolality: 279 (ref 275–301)
Potassium: 4.3 mmol/L (ref 3.5–5.1)
SGOT(AST): 11 U/L — ABNORMAL LOW (ref 15–37)
Sodium: 137 mmol/L (ref 136–145)
Total Protein: 6.1 g/dL — ABNORMAL LOW (ref 6.4–8.2)

## 2012-03-19 LAB — COMPREHENSIVE METABOLIC PANEL
Albumin: 3.1 g/dL — ABNORMAL LOW (ref 3.4–5.0)
BUN: 14 mg/dL (ref 7–18)
Bilirubin,Total: 0.5 mg/dL (ref 0.2–1.0)
Calcium, Total: 8.2 mg/dL — ABNORMAL LOW (ref 8.5–10.1)
Chloride: 101 mmol/L (ref 98–107)
Co2: 27 mmol/L (ref 21–32)
Creatinine: 1.08 mg/dL (ref 0.60–1.30)
EGFR (African American): >60
EGFR (Non-African Amer.): 53 — ABNORMAL LOW
Glucose: 131 mg/dL — ABNORMAL HIGH (ref 65–99)
Sodium: 139 mmol/L (ref 136–145)
Total Protein: 6 g/dL — ABNORMAL LOW (ref 6.4–8.2)

## 2012-03-19 LAB — CBC CANCER CENTER
Basophil #: 0 x10 3/mm (ref 0.0–0.1)
Basophil %: 0.5 %
Eosinophil #: 0 x10 3/mm (ref 0.0–0.7)
HGB: 13 g/dL (ref 12.0–16.0)
MCH: 30.3 pg (ref 26.0–34.0)
MCHC: 33.7 g/dL (ref 32.0–36.0)
Monocyte #: 0.5 x10 3/mm (ref 0.2–0.9)
Monocyte %: 6.4 %
Neutrophil #: 6.9 x10 3/mm — ABNORMAL HIGH (ref 1.4–6.5)
Neutrophil %: 86 %
WBC: 8 x10 3/mm (ref 3.6–11.0)

## 2012-04-02 LAB — CBC CANCER CENTER
Basophil #: 0 x10 3/mm (ref 0.0–0.1)
Basophil %: 1.7 %
Eosinophil #: 0 x10 3/mm (ref 0.0–0.7)
Eosinophil %: 4.2 %
HCT: 35.3 % (ref 35.0–47.0)
HGB: 12.2 g/dL (ref 12.0–16.0)
Lymphocyte %: 24.6 %
MCH: 30.7 pg (ref 26.0–34.0)
MCV: 89 fL (ref 80–100)
Monocyte #: 0.3 x10 3/mm (ref 0.2–0.9)
Monocyte %: 22.2 %
Neutrophil #: 0.5 x10 3/mm — ABNORMAL LOW (ref 1.4–6.5)
Platelet: 266 x10 3/mm (ref 150–440)
RBC: 3.97 10*6/uL (ref 3.80–5.20)
RDW: 15.7 % — ABNORMAL HIGH (ref 11.5–14.5)

## 2012-04-04 LAB — CBC CANCER CENTER
Basophil #: 0 x10 3/mm (ref 0.0–0.1)
Basophil %: 1.1 %
Eosinophil %: 1.4 %
Lymphocyte #: 0.5 x10 3/mm — ABNORMAL LOW (ref 1.0–3.6)
Lymphocyte %: 19.9 %
MCH: 30.2 pg (ref 26.0–34.0)
MCHC: 34.2 g/dL (ref 32.0–36.0)
MCV: 88 fL (ref 80–100)
Monocyte #: 0.5 x10 3/mm (ref 0.2–0.9)
Neutrophil %: 57 %
RDW: 15.5 % — ABNORMAL HIGH (ref 11.5–14.5)
WBC: 2.4 x10 3/mm — ABNORMAL LOW (ref 3.6–11.0)

## 2012-04-09 ENCOUNTER — Ambulatory Visit: Payer: Self-pay | Admitting: Oncology

## 2012-04-09 LAB — CBC CANCER CENTER
Basophil #: 0 x10 3/mm (ref 0.0–0.1)
Basophil %: 0.3 %
Lymphocyte #: 0.4 x10 3/mm — ABNORMAL LOW (ref 1.0–3.6)
MCHC: 33.5 g/dL (ref 32.0–36.0)
MCV: 90 fL (ref 80–100)
Monocyte #: 0.5 x10 3/mm (ref 0.2–0.9)
Monocyte %: 4.5 %
Platelet: 382 x10 3/mm (ref 150–440)
RBC: 4.1 10*6/uL (ref 3.80–5.20)
RDW: 16.5 % — ABNORMAL HIGH (ref 11.5–14.5)

## 2012-04-09 LAB — COMPREHENSIVE METABOLIC PANEL
Albumin: 3.3 g/dL — ABNORMAL LOW (ref 3.4–5.0)
Alkaline Phosphatase: 103 U/L (ref 50–136)
Anion Gap: 8 (ref 7–16)
BUN: 12 mg/dL (ref 7–18)
Bilirubin,Total: 0.3 mg/dL (ref 0.2–1.0)
Co2: 28 mmol/L (ref 21–32)
Glucose: 117 mg/dL — ABNORMAL HIGH (ref 65–99)
Osmolality: 275 (ref 275–301)
Potassium: 4.1 mmol/L (ref 3.5–5.1)
SGPT (ALT): 20 U/L (ref 12–78)
Sodium: 137 mmol/L (ref 136–145)

## 2012-04-16 LAB — CBC CANCER CENTER
Basophil %: 0.3 %
Eosinophil #: 0 x10 3/mm (ref 0.0–0.7)
Eosinophil %: 0.4 %
HCT: 37.5 % (ref 35.0–47.0)
Lymphocyte %: 4.7 %
MCH: 30 pg (ref 26.0–34.0)
MCHC: 33.3 g/dL (ref 32.0–36.0)
MCV: 90 fL (ref 80–100)
Monocyte %: 1.2 %
Neutrophil #: 8 x10 3/mm — ABNORMAL HIGH (ref 1.4–6.5)
RBC: 4.16 10*6/uL (ref 3.80–5.20)

## 2012-04-23 LAB — CBC CANCER CENTER
Basophil #: 0 x10 3/mm (ref 0.0–0.1)
Lymphocyte #: 0.3 x10 3/mm — ABNORMAL LOW (ref 1.0–3.6)
Lymphocyte %: 11.6 %
MCV: 90 fL (ref 80–100)
RBC: 3.74 10*6/uL — ABNORMAL LOW (ref 3.80–5.20)
RDW: 16.9 % — ABNORMAL HIGH (ref 11.5–14.5)

## 2012-04-30 LAB — CBC CANCER CENTER
Basophil #: 0 x10 3/mm (ref 0.0–0.1)
HCT: 38 % (ref 35.0–47.0)
HGB: 12.9 g/dL (ref 12.0–16.0)
Lymphocyte %: 12.3 %
MCH: 30.7 pg (ref 26.0–34.0)
MCHC: 33.8 g/dL (ref 32.0–36.0)
Monocyte #: 0.6 x10 3/mm (ref 0.2–0.9)
Monocyte %: 13.8 %
Neutrophil #: 3.3 x10 3/mm (ref 1.4–6.5)
Platelet: 308 x10 3/mm (ref 150–440)
RDW: 16.8 % — ABNORMAL HIGH (ref 11.5–14.5)
WBC: 4.6 x10 3/mm (ref 3.6–11.0)

## 2012-05-07 LAB — CBC CANCER CENTER
Basophil %: 0.9 %
Eosinophil #: 0 x10 3/mm (ref 0.0–0.7)
Eosinophil %: 0.3 %
HCT: 35.1 % (ref 35.0–47.0)
Lymphocyte #: 0.4 x10 3/mm — ABNORMAL LOW (ref 1.0–3.6)
Lymphocyte %: 5.8 %
MCHC: 33.5 g/dL (ref 32.0–36.0)
Monocyte #: 0.7 x10 3/mm (ref 0.2–0.9)
Monocyte %: 9.4 %
Neutrophil #: 6.2 x10 3/mm (ref 1.4–6.5)
Neutrophil %: 83.6 %
Platelet: 221 x10 3/mm (ref 150–440)
RBC: 3.84 10*6/uL (ref 3.80–5.20)
WBC: 7.4 x10 3/mm (ref 3.6–11.0)

## 2012-05-07 LAB — COMPREHENSIVE METABOLIC PANEL
Alkaline Phosphatase: 98 U/L (ref 50–136)
BUN: 13 mg/dL (ref 7–18)
Bilirubin,Total: 0.4 mg/dL (ref 0.2–1.0)
Calcium, Total: 8.7 mg/dL (ref 8.5–10.1)
Co2: 28 mmol/L (ref 21–32)
EGFR (African American): >60
Glucose: 93 mg/dL (ref 65–99)
Osmolality: 277 (ref 275–301)
Total Protein: 5.8 g/dL — ABNORMAL LOW (ref 6.4–8.2)

## 2012-05-09 ENCOUNTER — Ambulatory Visit: Payer: Self-pay | Admitting: Oncology

## 2012-05-14 ENCOUNTER — Encounter: Payer: Self-pay | Admitting: Internal Medicine

## 2012-05-14 ENCOUNTER — Ambulatory Visit (INDEPENDENT_AMBULATORY_CARE_PROVIDER_SITE_OTHER): Payer: Medicare Other | Admitting: Internal Medicine

## 2012-05-14 VITALS — BP 110/80 | HR 85 | Temp 97.8°F | Wt 115.0 lb

## 2012-05-14 DIAGNOSIS — M795 Residual foreign body in soft tissue: Secondary | ICD-10-CM | POA: Insufficient documentation

## 2012-05-14 DIAGNOSIS — Z1839 Other retained organic fragments: Secondary | ICD-10-CM

## 2012-05-14 LAB — CBC CANCER CENTER
Basophil %: 0.4 %
Eosinophil %: 2 %
HGB: 12.3 g/dL (ref 12.0–16.0)
MCH: 31.3 pg (ref 26.0–34.0)
Monocyte #: 0.1 x10 3/mm — ABNORMAL LOW (ref 0.2–0.9)
Neutrophil #: 5.3 x10 3/mm (ref 1.4–6.5)
Neutrophil %: 90 %
RBC: 3.94 10*6/uL (ref 3.80–5.20)
RDW: 16.2 % — ABNORMAL HIGH (ref 11.5–14.5)
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
## 2012-05-14 NOTE — Assessment & Plan Note (Signed)
Retained head parts removed with some difficulty with forceps and clamp Band aid applied Discussed using topical antibiotic

## 2012-05-21 LAB — CBC CANCER CENTER
Basophil #: 0 x10 3/mm (ref 0.0–0.1)
Basophil %: 1.3 %
Eosinophil #: 0 x10 3/mm (ref 0.0–0.7)
Lymphocyte #: 0.3 x10 3/mm — ABNORMAL LOW (ref 1.0–3.6)
MCH: 31.3 pg (ref 26.0–34.0)
MCHC: 33.6 g/dL (ref 32.0–36.0)
MCV: 93 fL (ref 80–100)
Monocyte %: 14.9 %
Platelet: 273 x10 3/mm (ref 150–440)
WBC: 2.1 x10 3/mm — ABNORMAL LOW (ref 3.6–11.0)

## 2012-05-28 LAB — CBC CANCER CENTER
Basophil #: 0 x10 3/mm (ref 0.0–0.1)
Basophil %: 0.5 %
HCT: 36.4 % (ref 35.0–47.0)
Lymphocyte %: 9.5 %
MCH: 31.6 pg (ref 26.0–34.0)
Monocyte #: 0.6 x10 3/mm (ref 0.2–0.9)
Neutrophil %: 76 %
Platelet: 305 x10 3/mm (ref 150–440)
RBC: 3.95 10*6/uL (ref 3.80–5.20)

## 2012-06-04 LAB — COMPREHENSIVE METABOLIC PANEL
Albumin: 3 g/dL — ABNORMAL LOW (ref 3.4–5.0)
Bilirubin,Total: 0.4 mg/dL (ref 0.2–1.0)
Calcium, Total: 8.7 mg/dL (ref 8.5–10.1)
Chloride: 102 mmol/L (ref 98–107)
Co2: 24 mmol/L (ref 21–32)
Creatinine: 0.86 mg/dL (ref 0.60–1.30)
EGFR (Non-African Amer.): >60
Osmolality: 272 (ref 275–301)
SGOT(AST): 14 U/L — ABNORMAL LOW (ref 15–37)
SGPT (ALT): 18 U/L (ref 12–78)

## 2012-06-04 LAB — CBC CANCER CENTER
Basophil %: 0.6 %
Eosinophil #: 0 x10 3/mm (ref 0.0–0.7)
HCT: 33.7 % — ABNORMAL LOW (ref 35.0–47.0)
Lymphocyte #: 0.6 x10 3/mm — ABNORMAL LOW (ref 1.0–3.6)
Lymphocyte %: 7.8 %
MCH: 31 pg (ref 26.0–34.0)
MCV: 93 fL (ref 80–100)
Monocyte #: 0.7 x10 3/mm (ref 0.2–0.9)
Monocyte %: 9.5 %
Neutrophil #: 6.3 x10 3/mm (ref 1.4–6.5)
Platelet: 251 x10 3/mm (ref 150–440)
RBC: 3.63 10*6/uL — ABNORMAL LOW (ref 3.80–5.20)

## 2012-06-09 ENCOUNTER — Ambulatory Visit: Payer: Self-pay | Admitting: Oncology

## 2012-06-11 LAB — COMPREHENSIVE METABOLIC PANEL
Alkaline Phosphatase: 99 U/L (ref 50–136)
Anion Gap: 12 (ref 7–16)
Calcium, Total: 8.5 mg/dL (ref 8.5–10.1)
Chloride: 102 mmol/L (ref 98–107)
Creatinine: 1.13 mg/dL (ref 0.60–1.30)
Glucose: 95 mg/dL (ref 65–99)
Osmolality: 274 (ref 275–301)
Potassium: 4.4 mmol/L (ref 3.5–5.1)
SGOT(AST): 14 U/L — ABNORMAL LOW (ref 15–37)
Sodium: 138 mmol/L (ref 136–145)
Total Protein: 6 g/dL — ABNORMAL LOW (ref 6.4–8.2)

## 2012-06-11 LAB — CBC CANCER CENTER
Basophil #: 0 x10 3/mm (ref 0.0–0.1)
Eosinophil #: 0 x10 3/mm (ref 0.0–0.7)
Eosinophil %: 0.2 %
HCT: 34.5 % — ABNORMAL LOW (ref 35.0–47.0)
Lymphocyte %: 6.4 %
MCH: 31.9 pg (ref 26.0–34.0)
MCHC: 34 g/dL (ref 32.0–36.0)
MCV: 94 fL (ref 80–100)
Monocyte %: 9.1 %
Neutrophil #: 4.9 x10 3/mm (ref 1.4–6.5)
Neutrophil %: 83.8 %
Platelet: 247 x10 3/mm (ref 150–440)
RDW: 15.3 % — ABNORMAL HIGH (ref 11.5–14.5)
WBC: 5.9 x10 3/mm (ref 3.6–11.0)

## 2012-06-17 LAB — CBC CANCER CENTER
Eosinophil #: 0.1 x10 3/mm (ref 0.0–0.7)
Eosinophil %: 1.4 %
Lymphocyte #: 0.5 x10 3/mm — ABNORMAL LOW (ref 1.0–3.6)
Lymphocyte %: 7.1 %
MCHC: 34.6 g/dL (ref 32.0–36.0)
Monocyte #: 0.5 x10 3/mm (ref 0.2–0.9)
Monocyte %: 6.9 %
Platelet: 338 x10 3/mm (ref 150–440)
RBC: 3.88 10*6/uL (ref 3.80–5.20)
RDW: 15.3 % — ABNORMAL HIGH (ref 11.5–14.5)
WBC: 7.2 x10 3/mm (ref 3.6–11.0)

## 2012-06-24 LAB — CBC CANCER CENTER
Eosinophil #: 0 x10 3/mm (ref 0.0–0.7)
HGB: 12.4 g/dL (ref 12.0–16.0)
Lymphocyte #: 0.4 x10 3/mm — ABNORMAL LOW (ref 1.0–3.6)
MCHC: 32.9 g/dL (ref 32.0–36.0)
MCV: 93 fL (ref 80–100)
Monocyte #: 0.7 x10 3/mm (ref 0.2–0.9)
Monocyte %: 4.3 %
Neutrophil #: 15.8 x10 3/mm — ABNORMAL HIGH (ref 1.4–6.5)
RDW: 15.2 % — ABNORMAL HIGH (ref 11.5–14.5)

## 2012-06-24 LAB — COMPREHENSIVE METABOLIC PANEL
Alkaline Phosphatase: 91 U/L (ref 50–136)
BUN: 14 mg/dL (ref 7–18)
Bilirubin,Total: 0.4 mg/dL (ref 0.2–1.0)
Chloride: 101 mmol/L (ref 98–107)
Co2: 27 mmol/L (ref 21–32)
Creatinine: 1.16 mg/dL (ref 0.60–1.30)
EGFR (African American): 56 — ABNORMAL LOW
EGFR (Non-African Amer.): 48 — ABNORMAL LOW
Osmolality: 273 (ref 275–301)
Total Protein: 6.2 g/dL — ABNORMAL LOW (ref 6.4–8.2)

## 2012-06-27 LAB — CBC CANCER CENTER
Basophil #: 0 x10 3/mm (ref 0.0–0.1)
Eosinophil #: 0.1 x10 3/mm (ref 0.0–0.7)
Eosinophil %: 0.4 %
HCT: 37 % (ref 35.0–47.0)
HGB: 12.4 g/dL (ref 12.0–16.0)
Lymphocyte %: 0.7 %
MCH: 31.3 pg (ref 26.0–34.0)
MCHC: 33.6 g/dL (ref 32.0–36.0)
MCV: 93 fL (ref 80–100)
Monocyte %: 0.1 %
Neutrophil #: 14.5 x10 3/mm — ABNORMAL HIGH (ref 1.4–6.5)
Neutrophil %: 98.7 %
RBC: 3.97 10*6/uL (ref 3.80–5.20)
RDW: 15.1 % — ABNORMAL HIGH (ref 11.5–14.5)

## 2012-06-27 LAB — COMPREHENSIVE METABOLIC PANEL
Alkaline Phosphatase: 96 U/L (ref 50–136)
Anion Gap: 9 (ref 7–16)
Calcium, Total: 8.2 mg/dL — ABNORMAL LOW (ref 8.5–10.1)
Co2: 26 mmol/L (ref 21–32)
Creatinine: 1.06 mg/dL (ref 0.60–1.30)
EGFR (African American): >60
EGFR (Non-African Amer.): 54 — ABNORMAL LOW
Osmolality: 266 (ref 275–301)
Potassium: 3.7 mmol/L (ref 3.5–5.1)
SGOT(AST): 25 U/L (ref 15–37)

## 2012-06-27 LAB — URINALYSIS, COMPLETE
Bilirubin,UR: NEGATIVE
Glucose,UR: NEGATIVE mg/dL (ref 0–75)
Ph: 6 (ref 4.5–8.0)
Protein: 100
RBC,UR: 1 /HPF (ref 0–5)
Specific Gravity: 1.018 (ref 1.003–1.030)
WBC UR: 13 /HPF (ref 0–5)

## 2012-06-28 LAB — URINE CULTURE

## 2012-07-01 LAB — CBC CANCER CENTER
Basophil %: 0 %
Eosinophil #: 0 x10 3/mm (ref 0.0–0.7)
Eosinophil %: 0.1 %
HCT: 37.5 % (ref 35.0–47.0)
HGB: 13 g/dL (ref 12.0–16.0)
Lymphocyte #: 0.3 x10 3/mm — ABNORMAL LOW (ref 1.0–3.6)
Lymphocyte %: 7.5 %
MCHC: 34.6 g/dL (ref 32.0–36.0)
MCV: 92 fL (ref 80–100)
Monocyte #: 0.1 x10 3/mm — ABNORMAL LOW (ref 0.2–0.9)
Monocyte %: 2.4 %
Neutrophil %: 90 %
RBC: 4.09 10*6/uL (ref 3.80–5.20)
RDW: 15.2 % — ABNORMAL HIGH (ref 11.5–14.5)
WBC: 4.1 x10 3/mm (ref 3.6–11.0)

## 2012-07-01 LAB — COMPREHENSIVE METABOLIC PANEL
Alkaline Phosphatase: 82 U/L (ref 50–136)
BUN: 12 mg/dL (ref 7–18)
Bilirubin,Total: 0.4 mg/dL (ref 0.2–1.0)
Calcium, Total: 8 mg/dL — ABNORMAL LOW (ref 8.5–10.1)
Chloride: 101 mmol/L (ref 98–107)
EGFR (African American): >60
Osmolality: 270 (ref 275–301)
Potassium: 3.5 mmol/L (ref 3.5–5.1)
SGOT(AST): 34 U/L (ref 15–37)
Sodium: 135 mmol/L — ABNORMAL LOW (ref 136–145)

## 2012-07-03 LAB — CULTURE, BLOOD (SINGLE)

## 2012-07-08 LAB — CBC CANCER CENTER
Basophil %: 0.7 %
Eosinophil #: 0 x10 3/mm (ref 0.0–0.7)
HCT: 30 % — ABNORMAL LOW (ref 35.0–47.0)
Lymphocyte #: 0.1 x10 3/mm — ABNORMAL LOW (ref 1.0–3.6)
MCH: 31.6 pg (ref 26.0–34.0)
MCV: 89 fL (ref 80–100)
Monocyte #: 0 x10 3/mm — ABNORMAL LOW (ref 0.2–0.9)
Monocyte %: 4.5 %
Neutrophil #: 0.2 x10 3/mm — ABNORMAL LOW (ref 1.4–6.5)
Neutrophil %: 72.8 %
Platelet: 10 x10 3/mm — CL (ref 150–440)
RBC: 3.38 10*6/uL — ABNORMAL LOW (ref 3.80–5.20)
RDW: 14.1 % (ref 11.5–14.5)
WBC: 0.3 x10 3/mm — CL (ref 3.6–11.0)

## 2012-07-09 ENCOUNTER — Ambulatory Visit: Payer: Self-pay | Admitting: Oncology

## 2012-07-17 LAB — CBC CANCER CENTER
Basophil #: 0 x10 3/mm (ref 0.0–0.1)
Eosinophil %: 2.2 %
HCT: 29.3 % — ABNORMAL LOW (ref 35.0–47.0)
Lymphocyte #: 0.3 x10 3/mm — ABNORMAL LOW (ref 1.0–3.6)
Lymphocyte %: 14.6 %
MCH: 31.1 pg (ref 26.0–34.0)
MCHC: 34.8 g/dL (ref 32.0–36.0)
MCV: 89 fL (ref 80–100)
Monocyte #: 1 x10 3/mm — ABNORMAL HIGH (ref 0.2–0.9)
Neutrophil #: 0.7 x10 3/mm — ABNORMAL LOW (ref 1.4–6.5)
Neutrophil %: 33.2 %
RBC: 3.28 10*6/uL — ABNORMAL LOW (ref 3.80–5.20)
WBC: 2 x10 3/mm — CL (ref 3.6–11.0)

## 2012-07-17 LAB — COMPREHENSIVE METABOLIC PANEL
Albumin: 3.1 g/dL — ABNORMAL LOW (ref 3.4–5.0)
Alkaline Phosphatase: 140 U/L — ABNORMAL HIGH (ref 50–136)
Anion Gap: 9 (ref 7–16)
BUN: 5 mg/dL — ABNORMAL LOW (ref 7–18)
Bilirubin,Total: 0.4 mg/dL (ref 0.2–1.0)
Calcium, Total: 9.5 mg/dL (ref 8.5–10.1)
Chloride: 103 mmol/L (ref 98–107)
Potassium: 3.9 mmol/L (ref 3.5–5.1)
SGOT(AST): 23 U/L (ref 15–37)
Sodium: 140 mmol/L (ref 136–145)
Total Protein: 6.3 g/dL — ABNORMAL LOW (ref 6.4–8.2)

## 2012-07-22 LAB — COMPREHENSIVE METABOLIC PANEL
Alkaline Phosphatase: 127 U/L (ref 50–136)
BUN: 9 mg/dL (ref 7–18)
Bilirubin,Total: 0.3 mg/dL (ref 0.2–1.0)
Chloride: 104 mmol/L (ref 98–107)
EGFR (Non-African Amer.): 42 — ABNORMAL LOW
Glucose: 114 mg/dL — ABNORMAL HIGH (ref 65–99)
Osmolality: 283 (ref 275–301)
Potassium: 3.6 mmol/L (ref 3.5–5.1)
Total Protein: 6 g/dL — ABNORMAL LOW (ref 6.4–8.2)

## 2012-07-22 LAB — CBC CANCER CENTER
Basophil #: 0.1 x10 3/mm (ref 0.0–0.1)
Eosinophil #: 0 x10 3/mm (ref 0.0–0.7)
Lymphocyte #: 0.4 x10 3/mm — ABNORMAL LOW (ref 1.0–3.6)
Lymphocyte %: 7.1 %
MCHC: 35.5 g/dL (ref 32.0–36.0)
MCV: 90 fL (ref 80–100)
Monocyte %: 24.1 %
Neutrophil #: 3.7 x10 3/mm (ref 1.4–6.5)
Platelet: 458 x10 3/mm — ABNORMAL HIGH (ref 150–440)
RBC: 3.24 10*6/uL — ABNORMAL LOW (ref 3.80–5.20)
RDW: 15.9 % — ABNORMAL HIGH (ref 11.5–14.5)
WBC: 5.4 x10 3/mm (ref 3.6–11.0)

## 2012-07-29 LAB — CBC CANCER CENTER
Basophil #: 0 x10 3/mm (ref 0.0–0.1)
Eosinophil #: 0 x10 3/mm (ref 0.0–0.7)
Eosinophil %: 0 %
HGB: 10.5 g/dL — ABNORMAL LOW (ref 12.0–16.0)
Lymphocyte #: 0.1 x10 3/mm — ABNORMAL LOW (ref 1.0–3.6)
Lymphocyte %: 1.3 %
MCH: 31.5 pg (ref 26.0–34.0)
MCHC: 35.6 g/dL (ref 32.0–36.0)
MCV: 88 fL (ref 80–100)
Monocyte #: 0.3 x10 3/mm (ref 0.2–0.9)
Monocyte %: 3.7 %
Neutrophil %: 94.8 %
Platelet: 170 x10 3/mm (ref 150–440)
WBC: 7.2 x10 3/mm (ref 3.6–11.0)

## 2012-08-05 LAB — CBC CANCER CENTER
Basophil #: 0 x10 3/mm (ref 0.0–0.1)
Basophil %: 0.2 %
Eosinophil %: 0 %
HCT: 25.5 % — ABNORMAL LOW (ref 35.0–47.0)
HGB: 9.1 g/dL — ABNORMAL LOW (ref 12.0–16.0)
Lymphocyte #: 0.1 x10 3/mm — ABNORMAL LOW (ref 1.0–3.6)
Lymphocyte %: 2.8 %
MCHC: 35.6 g/dL (ref 32.0–36.0)
MCV: 88 fL (ref 80–100)
Monocyte #: 0.1 x10 3/mm — ABNORMAL LOW (ref 0.2–0.9)
Neutrophil #: 2.8 x10 3/mm (ref 1.4–6.5)
Platelet: 44 x10 3/mm — ABNORMAL LOW (ref 150–440)
RDW: 15.1 % — ABNORMAL HIGH (ref 11.5–14.5)
WBC: 3 x10 3/mm — ABNORMAL LOW (ref 3.6–11.0)

## 2012-08-05 LAB — COMPREHENSIVE METABOLIC PANEL
Alkaline Phosphatase: 117 U/L (ref 50–136)
Anion Gap: 9 (ref 7–16)
Bilirubin,Total: 0.5 mg/dL (ref 0.2–1.0)
Chloride: 100 mmol/L (ref 98–107)
Creatinine: 0.87 mg/dL (ref 0.60–1.30)
EGFR (African American): >60
Glucose: 98 mg/dL (ref 65–99)
Osmolality: 271 (ref 275–301)
Potassium: 4.3 mmol/L (ref 3.5–5.1)
SGOT(AST): 13 U/L — ABNORMAL LOW (ref 15–37)
SGPT (ALT): 19 U/L (ref 12–78)
Sodium: 135 mmol/L — ABNORMAL LOW (ref 136–145)

## 2012-08-09 ENCOUNTER — Ambulatory Visit: Payer: Self-pay | Admitting: Oncology

## 2012-08-13 ENCOUNTER — Ambulatory Visit: Payer: Self-pay | Admitting: Oncology

## 2012-08-13 LAB — CBC CANCER CENTER
Basophil %: 0.2 %
Eosinophil #: 0 x10 3/mm (ref 0.0–0.7)
Lymphocyte #: 0.2 x10 3/mm — ABNORMAL LOW (ref 1.0–3.6)
Lymphocyte %: 4.9 %
MCH: 32.2 pg (ref 26.0–34.0)
MCHC: 35.3 g/dL (ref 32.0–36.0)
Monocyte #: 0.8 x10 3/mm (ref 0.2–0.9)
Monocyte %: 15.4 %
Neutrophil #: 3.9 x10 3/mm (ref 1.4–6.5)
Neutrophil %: 79.5 %
Platelet: 259 x10 3/mm (ref 150–440)
RBC: 2.88 10*6/uL — ABNORMAL LOW (ref 3.80–5.20)

## 2012-08-15 ENCOUNTER — Encounter: Payer: Self-pay | Admitting: Oncology

## 2012-08-26 LAB — CBC CANCER CENTER
Basophil #: 0 x10 3/mm (ref 0.0–0.1)
Basophil %: 0.3 %
Eosinophil #: 0 x10 3/mm (ref 0.0–0.7)
HCT: 29 % — ABNORMAL LOW (ref 35.0–47.0)
HGB: 9.9 g/dL — ABNORMAL LOW (ref 12.0–16.0)
Lymphocyte #: 0.3 x10 3/mm — ABNORMAL LOW (ref 1.0–3.6)
MCH: 32.7 pg (ref 26.0–34.0)
MCV: 96 fL (ref 80–100)
Monocyte %: 7.3 %
Neutrophil %: 89.1 %
RDW: 22.1 % — ABNORMAL HIGH (ref 11.5–14.5)
WBC: 9.5 x10 3/mm (ref 3.6–11.0)

## 2012-08-26 LAB — COMPREHENSIVE METABOLIC PANEL
Albumin: 3.3 g/dL — ABNORMAL LOW (ref 3.4–5.0)
Anion Gap: 7 (ref 7–16)
BUN: 16 mg/dL (ref 7–18)
Calcium, Total: 8.7 mg/dL (ref 8.5–10.1)
Co2: 29 mmol/L (ref 21–32)
Creatinine: 0.88 mg/dL (ref 0.60–1.30)
EGFR (African American): >60
SGOT(AST): 12 U/L — ABNORMAL LOW (ref 15–37)
Sodium: 139 mmol/L (ref 136–145)
Total Protein: 5.9 g/dL — ABNORMAL LOW (ref 6.4–8.2)

## 2012-08-27 LAB — CANCER ANTIGEN 27.29: CA 27.29: 102.7 U/mL — ABNORMAL HIGH (ref 0.0–38.6)

## 2012-08-28 LAB — CBC CANCER CENTER
Basophil %: 0.3 %
Eosinophil %: 0 %
HGB: 10.6 g/dL — ABNORMAL LOW (ref 12.0–16.0)
Lymphocyte %: 0.7 %
MCH: 32.1 pg (ref 26.0–34.0)
MCHC: 33.5 g/dL (ref 32.0–36.0)
MCV: 96 fL (ref 80–100)
Monocyte #: 0.3 x10 3/mm (ref 0.2–0.9)
Monocyte %: 2.8 %
Neutrophil %: 96.2 %
Platelet: 290 x10 3/mm (ref 150–440)
RBC: 3.3 10*6/uL — ABNORMAL LOW (ref 3.80–5.20)
WBC: 12.6 x10 3/mm — ABNORMAL HIGH (ref 3.6–11.0)

## 2012-08-28 LAB — COMPREHENSIVE METABOLIC PANEL
Alkaline Phosphatase: 108 U/L (ref 50–136)
BUN: 28 mg/dL — ABNORMAL HIGH (ref 7–18)
Bilirubin,Total: 0.5 mg/dL (ref 0.2–1.0)
Chloride: 102 mmol/L (ref 98–107)
Co2: 30 mmol/L (ref 21–32)
EGFR (African American): >60
EGFR (Non-African Amer.): >60
Glucose: 95 mg/dL (ref 65–99)
Osmolality: 279 (ref 275–301)
Potassium: 4.2 mmol/L (ref 3.5–5.1)
SGPT (ALT): 25 U/L (ref 12–78)
Total Protein: 6.2 g/dL — ABNORMAL LOW (ref 6.4–8.2)

## 2012-09-02 LAB — CBC CANCER CENTER
Basophil #: 0 x10 3/mm (ref 0.0–0.1)
Basophil %: 0.1 %
Eosinophil #: 0 x10 3/mm (ref 0.0–0.7)
HGB: 10.1 g/dL — ABNORMAL LOW (ref 12.0–16.0)
MCH: 32.7 pg (ref 26.0–34.0)
MCV: 95 fL (ref 80–100)
Monocyte #: 0.2 x10 3/mm (ref 0.2–0.9)
Neutrophil #: 4.8 x10 3/mm (ref 1.4–6.5)
Neutrophil %: 91.5 %
Platelet: 168 x10 3/mm (ref 150–440)
RBC: 3.09 10*6/uL — ABNORMAL LOW (ref 3.80–5.20)
RDW: 20.4 % — ABNORMAL HIGH (ref 11.5–14.5)

## 2012-09-02 LAB — BASIC METABOLIC PANEL
Anion Gap: 8 (ref 7–16)
Chloride: 105 mmol/L (ref 98–107)
Co2: 26 mmol/L (ref 21–32)
EGFR (African American): >60
EGFR (Non-African Amer.): >60
Glucose: 110 mg/dL — ABNORMAL HIGH (ref 65–99)
Osmolality: 279 (ref 275–301)
Potassium: 3.6 mmol/L (ref 3.5–5.1)

## 2012-09-09 ENCOUNTER — Ambulatory Visit: Payer: Self-pay | Admitting: Oncology

## 2012-09-10 ENCOUNTER — Encounter: Payer: Self-pay | Admitting: Oncology

## 2012-09-16 ENCOUNTER — Ambulatory Visit: Payer: Self-pay | Admitting: Oncology

## 2012-09-16 LAB — COMPREHENSIVE METABOLIC PANEL
Anion Gap: 11 (ref 7–16)
Bilirubin,Total: 0.4 mg/dL (ref 0.2–1.0)
Co2: 28 mmol/L (ref 21–32)
EGFR (African American): >60
EGFR (Non-African Amer.): >60
Glucose: 107 mg/dL — ABNORMAL HIGH (ref 65–99)
Osmolality: 278 (ref 275–301)
Potassium: 3.4 mmol/L — ABNORMAL LOW (ref 3.5–5.1)
SGOT(AST): 12 U/L — ABNORMAL LOW (ref 15–37)
SGPT (ALT): 22 U/L (ref 12–78)
Sodium: 139 mmol/L (ref 136–145)

## 2012-09-16 LAB — CBC CANCER CENTER
Basophil #: 0 x10 3/mm (ref 0.0–0.1)
Basophil %: 0.2 %
Eosinophil %: 0.1 %
HGB: 8.7 g/dL — ABNORMAL LOW (ref 12.0–16.0)
Lymphocyte #: 0.2 x10 3/mm — ABNORMAL LOW (ref 1.0–3.6)
MCH: 33.5 pg (ref 26.0–34.0)
Monocyte #: 0.3 x10 3/mm (ref 0.2–0.9)
Monocyte %: 9.5 %
Neutrophil #: 2.9 x10 3/mm (ref 1.4–6.5)
Neutrophil %: 83.5 %
RBC: 2.59 10*6/uL — ABNORMAL LOW (ref 3.80–5.20)

## 2012-09-23 LAB — CBC CANCER CENTER
Bands: 5 %
HCT: 25.8 % — ABNORMAL LOW (ref 35.0–47.0)
HGB: 8.7 g/dL — ABNORMAL LOW (ref 12.0–16.0)
Lymphocytes: 7 %
MCHC: 33.8 g/dL (ref 32.0–36.0)
MCV: 99 fL (ref 80–100)
Metamyelocyte: 4 %
Monocytes: 5 %
NRBC/100 WBC: 4 /100
Platelet: 127 x10 3/mm — ABNORMAL LOW (ref 150–440)
RBC: 2.6 10*6/uL — ABNORMAL LOW (ref 3.80–5.20)
RDW: 19.7 % — ABNORMAL HIGH (ref 11.5–14.5)
Segmented Neutrophils: 73 %

## 2012-09-23 LAB — COMPREHENSIVE METABOLIC PANEL
Anion Gap: 11 (ref 7–16)
Bilirubin,Total: 0.5 mg/dL (ref 0.2–1.0)
Chloride: 102 mmol/L (ref 98–107)
Co2: 27 mmol/L (ref 21–32)
Creatinine: 0.89 mg/dL (ref 0.60–1.30)
EGFR (African American): >60
Glucose: 111 mg/dL — ABNORMAL HIGH (ref 65–99)
Osmolality: 279 (ref 275–301)
Potassium: 4 mmol/L (ref 3.5–5.1)
Sodium: 140 mmol/L (ref 136–145)
Total Protein: 6 g/dL — ABNORMAL LOW (ref 6.4–8.2)

## 2012-09-30 LAB — CBC CANCER CENTER
Basophil #: 0 x10 3/mm (ref 0.0–0.1)
Basophil %: 0.6 %
Eosinophil #: 0 x10 3/mm (ref 0.0–0.7)
Eosinophil %: 0.2 %
HGB: 8.4 g/dL — ABNORMAL LOW (ref 12.0–16.0)
Lymphocyte #: 0.2 x10 3/mm — ABNORMAL LOW (ref 1.0–3.6)
Lymphocyte %: 5.3 %
MCH: 33.6 pg (ref 26.0–34.0)
MCV: 102 fL — ABNORMAL HIGH (ref 80–100)
Monocyte #: 0.4 x10 3/mm (ref 0.2–0.9)
Monocyte %: 8.5 %
Neutrophil #: 3.8 x10 3/mm (ref 1.4–6.5)
RBC: 2.5 10*6/uL — ABNORMAL LOW (ref 3.80–5.20)
RDW: 20.3 % — ABNORMAL HIGH (ref 11.5–14.5)

## 2012-10-01 LAB — CANCER ANTIGEN 27.29: CA 27.29: 100.8 U/mL — ABNORMAL HIGH (ref 0.0–38.6)

## 2012-10-07 LAB — COMPREHENSIVE METABOLIC PANEL
Albumin: 3.3 g/dL — ABNORMAL LOW (ref 3.4–5.0)
Alkaline Phosphatase: 145 U/L — ABNORMAL HIGH (ref 50–136)
BUN: 13 mg/dL (ref 7–18)
Bilirubin,Total: 0.4 mg/dL (ref 0.2–1.0)
Calcium, Total: 9.1 mg/dL (ref 8.5–10.1)
Chloride: 102 mmol/L (ref 98–107)
Creatinine: 0.94 mg/dL (ref 0.60–1.30)
EGFR (African American): >60
Osmolality: 281 (ref 275–301)
SGPT (ALT): 20 U/L (ref 12–78)
Total Protein: 6.1 g/dL — ABNORMAL LOW (ref 6.4–8.2)

## 2012-10-07 LAB — CBC CANCER CENTER
Basophil #: 0.1 x10 3/mm (ref 0.0–0.1)
Basophil %: 0.6 %
Eosinophil #: 0 x10 3/mm (ref 0.0–0.7)
Eosinophil %: 0.4 %
MCHC: 32.6 g/dL (ref 32.0–36.0)
MCV: 103 fL — ABNORMAL HIGH (ref 80–100)
Monocyte #: 0.7 x10 3/mm (ref 0.2–0.9)
Monocyte %: 8.4 %
Neutrophil #: 7.7 x10 3/mm — ABNORMAL HIGH (ref 1.4–6.5)
Neutrophil %: 87.1 %
RBC: 2.82 10*6/uL — ABNORMAL LOW (ref 3.80–5.20)
RDW: 18.7 % — ABNORMAL HIGH (ref 11.5–14.5)

## 2012-10-09 ENCOUNTER — Ambulatory Visit: Payer: Self-pay | Admitting: Oncology

## 2012-10-14 ENCOUNTER — Encounter: Payer: Self-pay | Admitting: Internal Medicine

## 2012-10-22 ENCOUNTER — Telehealth: Payer: Self-pay

## 2012-10-22 NOTE — Telephone Encounter (Signed)
That sounds good

## 2012-10-22 NOTE — Telephone Encounter (Signed)
Vicky with Hospice will order flu vaccine and will give to pt 10/23/12; Unless Vicky receives call with different instructions, Lynden Ang will meet Dr Alphonsus Sias at pt's home on 10/23/12 at 4 pm.

## 2012-10-23 ENCOUNTER — Ambulatory Visit: Payer: Medicare Other | Admitting: Internal Medicine

## 2012-10-23 ENCOUNTER — Encounter: Payer: Self-pay | Admitting: Internal Medicine

## 2012-10-23 VITALS — BP 128/80 | HR 96 | Resp 16

## 2012-10-23 DIAGNOSIS — I10 Essential (primary) hypertension: Secondary | ICD-10-CM

## 2012-10-23 DIAGNOSIS — M169 Osteoarthritis of hip, unspecified: Secondary | ICD-10-CM

## 2012-10-23 DIAGNOSIS — C7931 Secondary malignant neoplasm of brain: Secondary | ICD-10-CM

## 2012-10-23 DIAGNOSIS — F411 Generalized anxiety disorder: Secondary | ICD-10-CM

## 2012-10-23 DIAGNOSIS — C50919 Malignant neoplasm of unspecified site of unspecified female breast: Secondary | ICD-10-CM

## 2012-10-23 DIAGNOSIS — C7949 Secondary malignant neoplasm of other parts of nervous system: Secondary | ICD-10-CM

## 2012-10-23 DIAGNOSIS — G589 Mononeuropathy, unspecified: Secondary | ICD-10-CM

## 2012-10-23 NOTE — Assessment & Plan Note (Signed)
May be contributing to her pain

## 2012-10-23 NOTE — Assessment & Plan Note (Signed)
And reactive depression  On reasonable citalopram dose I am not convinced a higher dose is appropriate

## 2012-10-23 NOTE — Assessment & Plan Note (Signed)
On gabapentin now---for "shakes" No known seizures Will have him hold afternoon dose and perhaps the morning dose to make sure it is not adding to her confusion

## 2012-10-23 NOTE — Assessment & Plan Note (Signed)
Chemo no longer helpful Some psychotic symptoms with the brain mets but not enough to warrant antipsychotics Now on hospice  Pain is hard to assess Will wean off the cyclobenzaprine since that might be causing more confusion May want to restart the fentanyl
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
## 2012-10-23 NOTE — Assessment & Plan Note (Signed)
BP Readings from Last 3 Encounters:  10/23/12 128/80  05/14/12 110/80  01/07/09 148/80   Fine now without Rx Will try to limit furosemide to when she has AM fluid

## 2012-11-05 LAB — CBC CANCER CENTER
Basophil #: 0 x10 3/mm (ref 0.0–0.1)
Basophil %: 0.2 %
Eosinophil #: 0 x10 3/mm (ref 0.0–0.7)
Eosinophil %: 0 %
HGB: 11.2 g/dL — ABNORMAL LOW (ref 12.0–16.0)
Lymphocyte #: 0.3 x10 3/mm — ABNORMAL LOW (ref 1.0–3.6)
Lymphocyte %: 2.1 %
MCH: 31.8 pg (ref 26.0–34.0)
MCHC: 32.7 g/dL (ref 32.0–36.0)
MCV: 97 fL (ref 80–100)
Monocyte #: 0.3 x10 3/mm (ref 0.2–0.9)
Monocyte %: 2 %
Neutrophil %: 95.7 %
Platelet: 251 x10 3/mm (ref 150–440)
RDW: 15.5 % — ABNORMAL HIGH (ref 11.5–14.5)
WBC: 13 x10 3/mm — ABNORMAL HIGH (ref 3.6–11.0)

## 2012-11-05 LAB — COMPREHENSIVE METABOLIC PANEL
Albumin: 3.2 g/dL — ABNORMAL LOW (ref 3.4–5.0)
Alkaline Phosphatase: 168 U/L — ABNORMAL HIGH (ref 50–136)
Anion Gap: 11 (ref 7–16)
BUN: 11 mg/dL (ref 7–18)
Bilirubin,Total: 0.5 mg/dL (ref 0.2–1.0)
Calcium, Total: 8.8 mg/dL (ref 8.5–10.1)
Creatinine: 0.86 mg/dL (ref 0.60–1.30)
EGFR (African American): >60
Osmolality: 280 (ref 275–301)
Potassium: 3.8 mmol/L (ref 3.5–5.1)
SGOT(AST): 21 U/L (ref 15–37)
Sodium: 140 mmol/L (ref 136–145)
Total Protein: 6.6 g/dL (ref 6.4–8.2)

## 2012-11-09 ENCOUNTER — Ambulatory Visit: Payer: Self-pay | Admitting: Oncology

## 2012-11-27 ENCOUNTER — Telehealth: Payer: Self-pay | Admitting: Internal Medicine

## 2012-11-27 NOTE — Telephone Encounter (Signed)
Called and passed on condolences to her husband

## 2012-11-27 NOTE — Telephone Encounter (Signed)
Vickie from [facility] called to let you pt passed at 6:24 a.m., today.  She also wanted to thank you for assisting in getting the DNR for patient. Thank you.

## 2012-12-09 DEATH — deceased

## 2013-01-03 IMAGING — CT CT PELVIS WO/W CM
3 of 4 series · 14 of 32 positions shown, 19 images · non-contrast
Comparison: none

REASON FOR EXAM: Status Post Repair Colovesical Fistua
COMMENTS:

[Series 2: soft tissue · axial · 0.70mm/px · z∈[-259,-118]mm · 6 of 67 slices shown, 11 images (1 of 2)]
[im 10/67  soft-tissue]
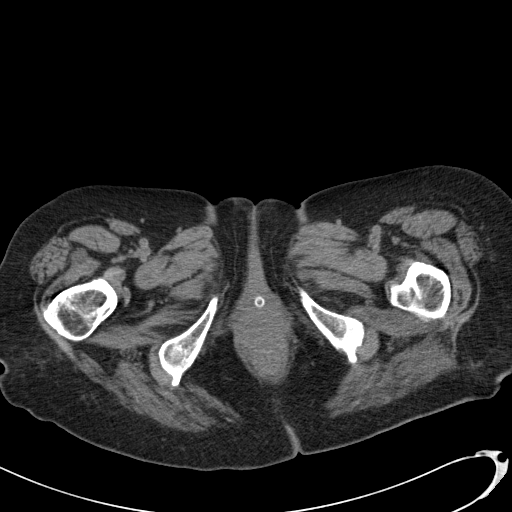
[im 10/67  bone]
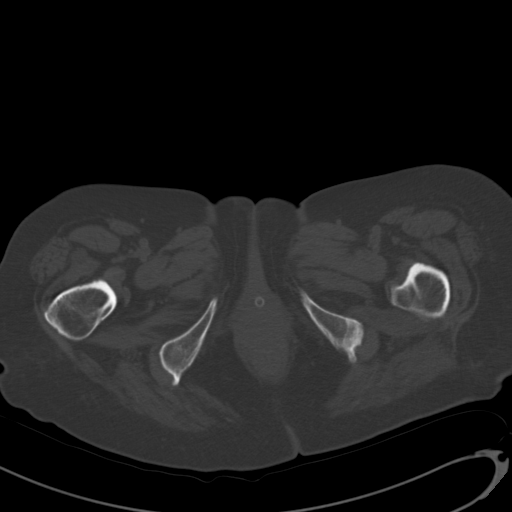
[im 19/67  soft-tissue]
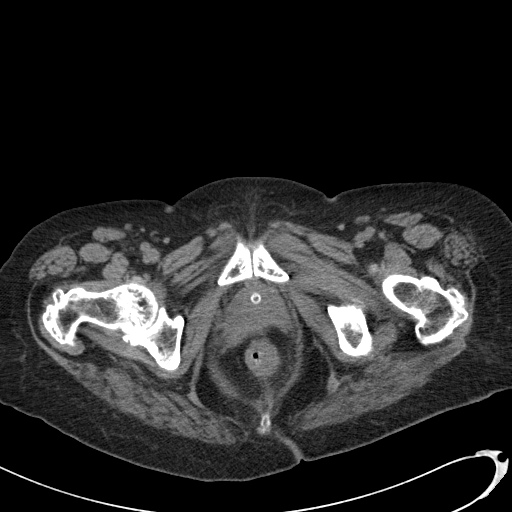
[im 29/67  soft-tissue]
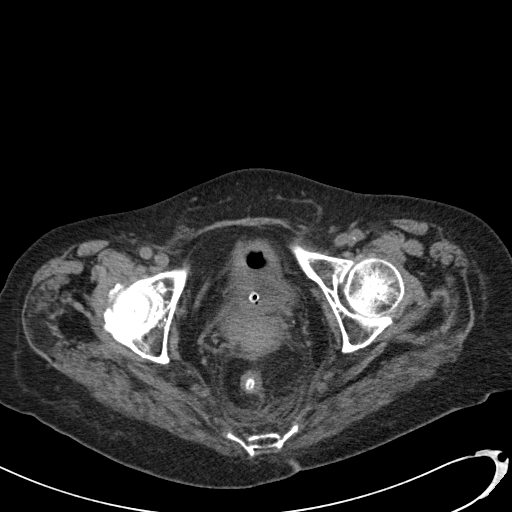
[im 29/67  lung]
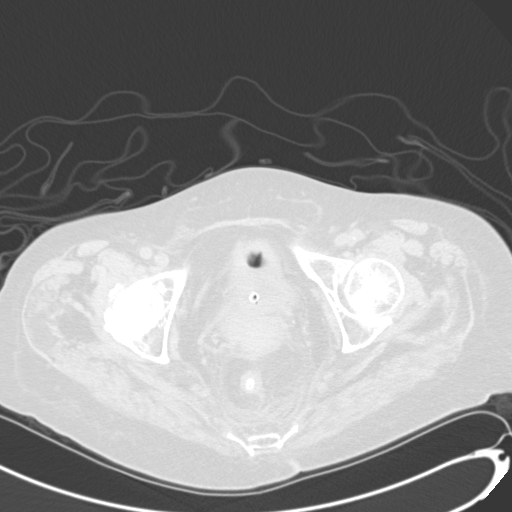
[im 38/67  soft-tissue]
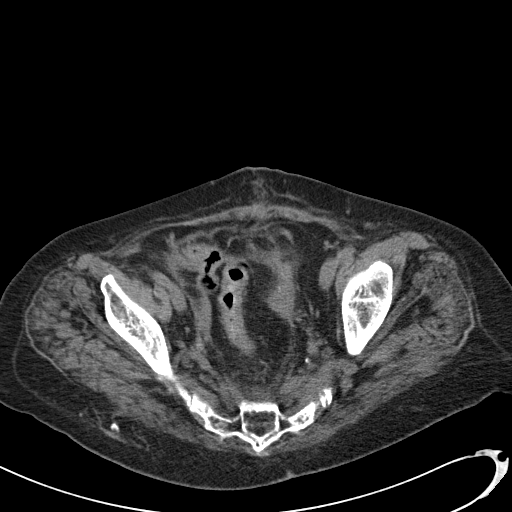
[im 38/67  lung]
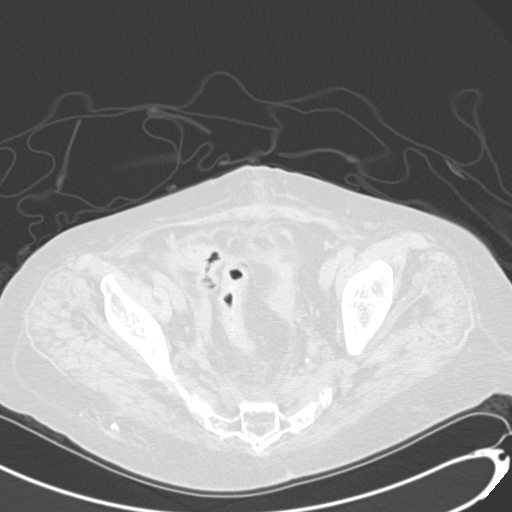
[im 48/67  soft-tissue]
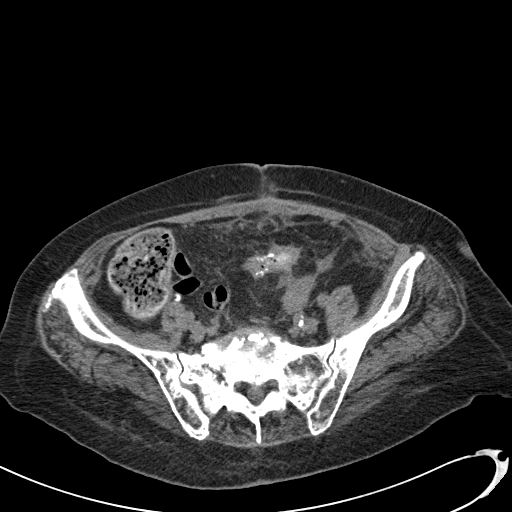
[im 48/67  lung]
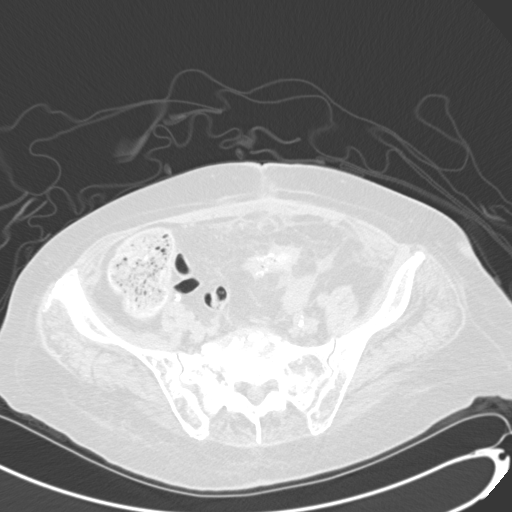
[im 57/67  soft-tissue]
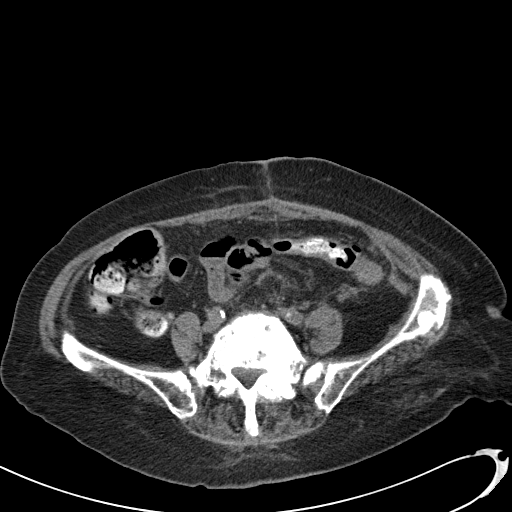
[im 57/67  lung]
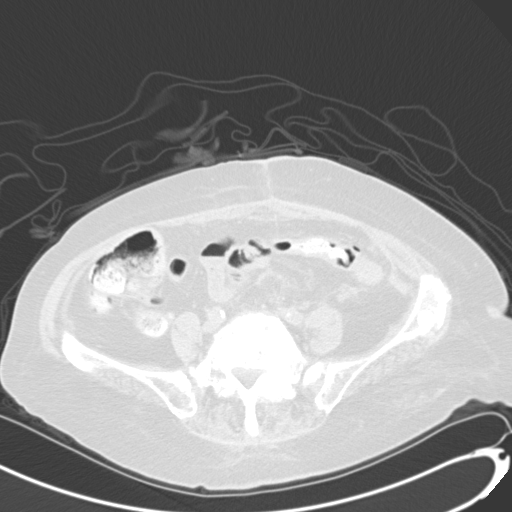

[Series 4: soft tissue · axial · 0.70mm/px · z∈[-259,-118]mm · 6 of 67 slices shown (2 of 2)]
[im 10/67  soft-tissue]
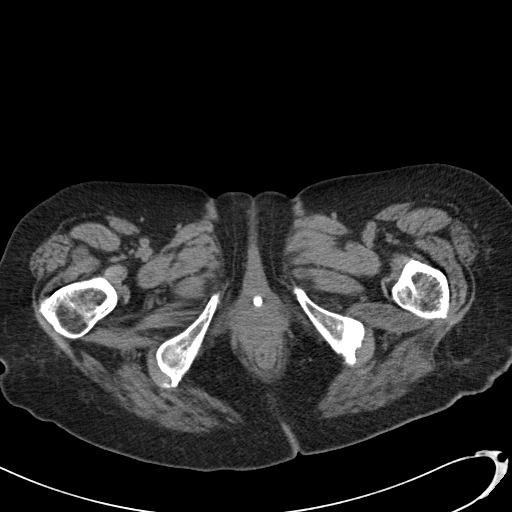
[im 19/67  soft-tissue]
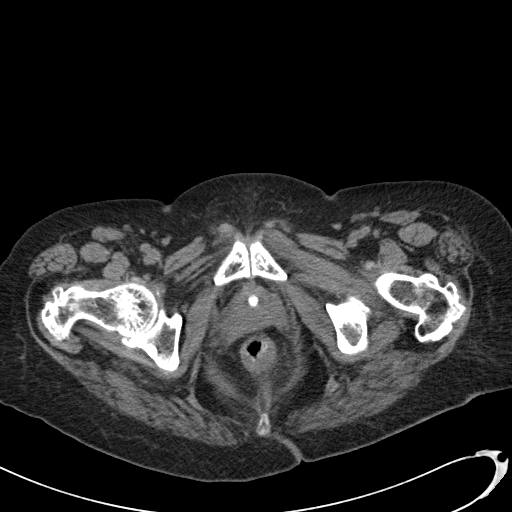
[im 29/67  soft-tissue]
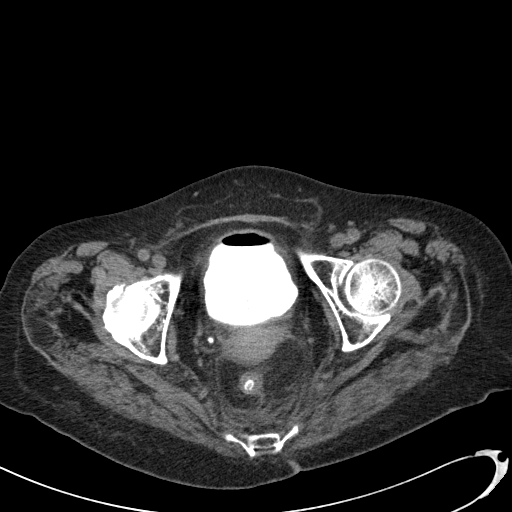
[im 38/67  soft-tissue]
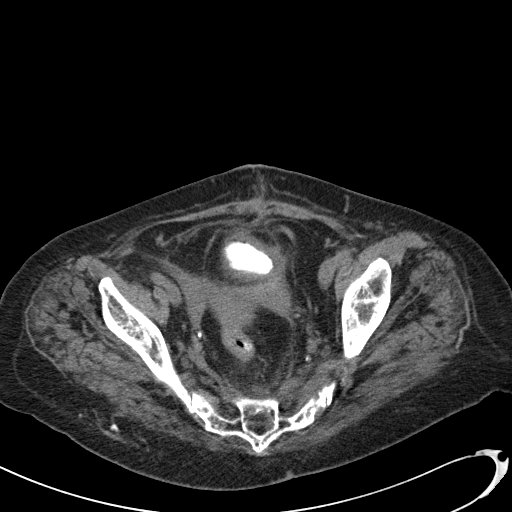
[im 48/67  soft-tissue]
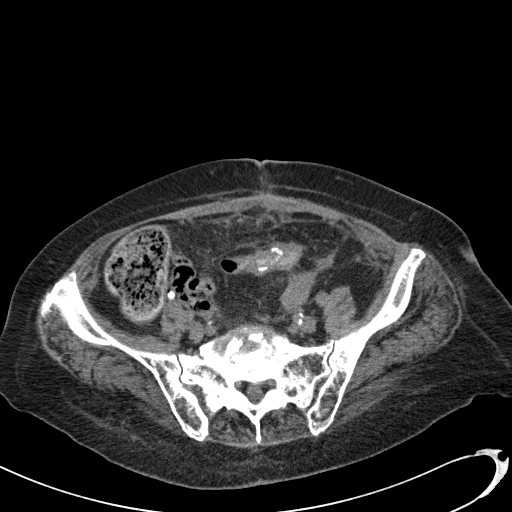
[im 57/67  soft-tissue]
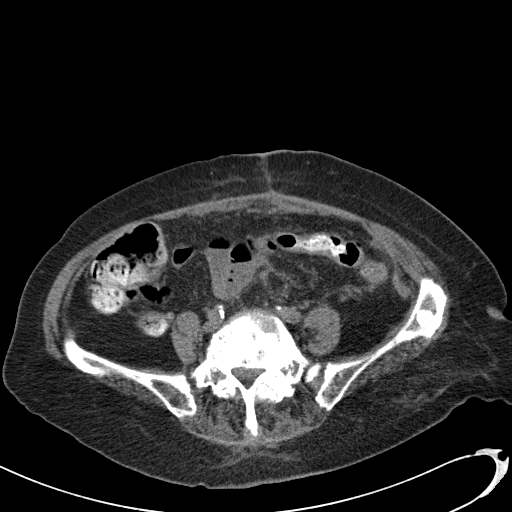

[Series 6: soft tissue 5 min delay · axial · delayed · 0.70mm/px · z∈[-259,-232]mm · 2 of 67 slices shown]
[im 10/67  soft-tissue]
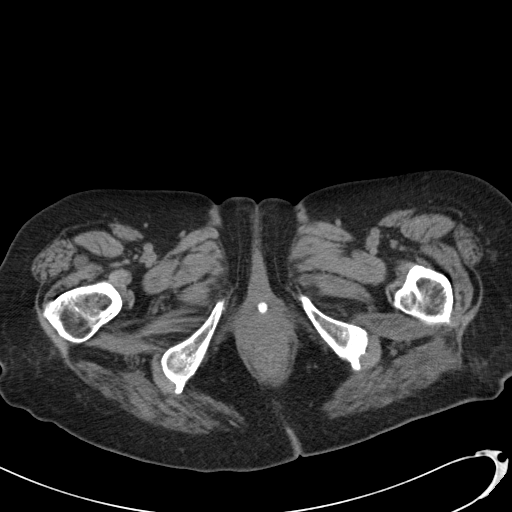
[im 19/67  soft-tissue]
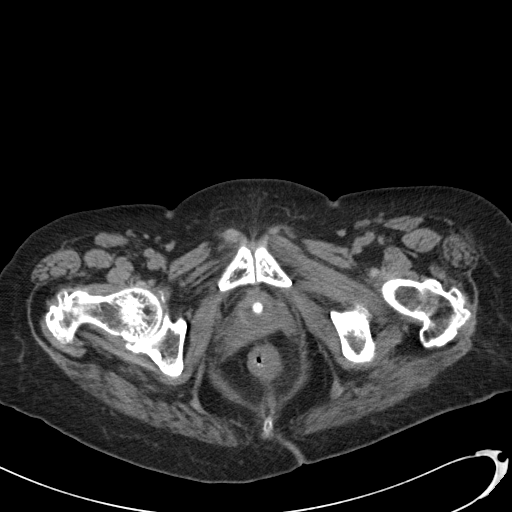

[14 of 32 positions shown; findings below may reference images not displayed]

PROCEDURE:     CT  - CT PELVIS W/WO CYSTOGRAM  - June 13, 2011 [DATE]

RESULT:

CT cystogram is performed. The initial images are without bladder contrast.
A Foley balloon is seen within the decompressed urinary bladder. There is
some air within the bladder. Surgical clips are seen along loops of bowel in
the supravesical region. There is some density within loops of bowel in the
anterior right mid abdominal region at the level of the umbilicus. Some
stranding is seen in the supravesical region.

100 ml's of Tsovue-I87 iodinated intravenous contrast were placed through
the Foley balloon into the urinary bladder. There is no evidence of
extravasation of contrast into the surrounding structures or loops of bowel.
Post drainage scanning shows a small amount of residual contrast
opacification in the urinary bladder.
IMPRESSION: No CT cystogram evidence of fistulous communication from
the bladder to loops of bowel or extravasation. Presumed post operative
changes are present as described. No definite abscess evident.

[REDACTED]

## 2014-05-03 NOTE — Op Note (Signed)
PATIENT NAME:  Donna Weber, Donna Weber MR#:  [id number] DATE OF BIRTH:  26-Jul-1945  DATE OF PROCEDURE:  06/01/2011  PREOPERATIVE DIAGNOSIS: Colovesical fistula.   POSTOPERATIVE DIAGNOSIS: Colovesical fistula.   PROCEDURES:  1. Cystoscopy.  2. Closure of bladder defect secondary to colovesical fistula.   SURGEON: Aziza Stuckert C. Bernardo Heater, MD   ASSISTANT: Dr. Jamal Collin     ANESTHETIC: General.   INDICATIONS: The patient is a 69 year old female with a urinary tract infection and passage of feculent material through her urine. She has a history of diverticulitis. CT scan remarkable for obvious stool within the bladder. She presents for exploratory laparotomy. Cystoscopy to be performed prior.   DESCRIPTION OF PROCEDURE: The patient was taken to the operating room where a general anesthetic was administered. She was placed in the low lithotomy position and her external genitalia were prepped and draped in the standard fashion. Time-out was performed. A 21 French cystoscope sheath with obturator was lubricated and passed per urethra. A moderate amount of feculent material was drained from the bladder. Bladder was irrigated with several syringes of feculent material, irrigated. The 30 degree lens was placed into the scope. The bladder mucosa was very erythematous. Ureteral orifices were hard to identify. At the dome of the bladder was a large opening which the scope easily entered and was consistent with colon. As much feculent material was irrigated from the bladder as possible and a 20 French Foley catheter was placed.   The patient was then reprepped and draped. Initial laparotomy was performed by Dr. Jamal Collin which had been dictated separately. A portion of sigmoid was adherent to the bladder and once separated approximately 4 cm defect was noted in the dome of the bladder. The edges of the bladder were mobilized and appeared viable. The posterior edge was more deficient than the anterior edge. This was closed with a  running 3-0 Vicryl suture. 60 mL of sterile saline was instilled into the bladder. There were some areas of leakage noted which were closed with interrupted 2-0 chromic sutures on a UR-6 needle. There was not enough tissue to make a second Lembert style closure. Once these figure-of-eight sutures were placed, no leakage was noted upon instillation of saline within the bladder. The colon resection and closure was performed by Dr. Jamal Collin which has been dictated separately.   ____________________________ Ronda Fairly Bernardo Heater, MD scs:drc D: 06/01/2011 17:21:13 ET T: 06/01/2011 18:05:46 ET JOB#: [id number]  cc: Nicki Reaper C. Bernardo Heater, MD, <Dictator> Abbie Sons MD ELECTRONICALLY SIGNED 06/08/2011 14:23

## 2014-05-03 NOTE — Consult Note (Signed)
Foley catheter draining amber urine.Since she had such a large defect would recommend leaving the catheter indwelling 10 days and will obtain a CT cystogram prior to removal.  She may followup as an outpatient for catheter removal.  Electronic Signatures: Abbie Sons (MD)  (Signed on 28-May-13 14:27)  Authored  Last Updated: 28-May-13 14:27 by Abbie Sons (MD)

## 2014-05-03 NOTE — Discharge Summary (Signed)
PATIENT NAME:  Donna Weber, Donna Weber MR#:  [id number] DATE OF BIRTH:  1945/12/12  DATE OF ADMISSION:  05/29/2011 DATE OF DISCHARGE:  06/08/2011  HISTORY: This is a 69 year old female who has history of breast cancer and subsequently developed bone mets for which she has been receiving chemotherapy and also has had radiation. The patient presented with a complaint of some blood in her urine, but was then noted to have actually fecal contamination of the urine with suspicion for colovesical fistula. The patient has had a history of diverticulosis and has had some mild episodes of diverticulitis off and on in the past. She was also having a significant amount of lower abdominal pain. White count has gone up from 4000 a week prior to admission to 23,000. The patient accordingly was admitted by oncology and internal medicine service. The patient was started on IV antibiotics and subsequent consultations were obtained from urology and from myself for surgical intervention. The patient had imaging with just diagnostic x-rays which were essentially not helpful and had a CT scan which showed a considerable amount of stool within the urinary bladder confirming the suspected fistula. It was felt the most likely source as the sigmoid colon which was closely applied to the dome of the bladder. No obstruction or ileus pattern was noted. After discussion with the patient and Dr. Bernardo Heater from urology, it was decided to proceed with laparotomy and correction of the colovesical fistula with resection and repair. With antibiotic treatment her white count gradually came down towards normal values. Chemistries basically were unremarkable.    COURSE IN THE HOSPITAL: The patient was taken to the operating room 06/01/2011 and underwent cystoscopy with lavage of the urinary bladder with confirmation of the colovesical fistula. Laparotomy was then performed with resection of the sigmoid colon and reanastomosis and the urinary bladder was  repaired. The Foley catheter was left in place for the duration of her hospital stay to allow for healing of the large defect in the bladder. Her postoperative course was complicated only by some mild hypoxia for which she had an Internal Medicine consult and this was felt to be secondary to mild congestive failure, which responded well to diuretics. The patient subsequently improved slowly with return of her saturations to normal level on room air. After the brief period of ileus for 48 hours, she was started on a liquid diet and then gradually advanced to solid food without any problems. She had a considerable amount of weakness and lack of strength and was not able to ambulate freely and be able to get out of bed  freely. Physical therapy consultation was obtained and with physical therapy she was able to ambulate better. Although recommendation was made for short-term rehab for the patient, in view of her weakness, she felt strong enough to go home, and after discussion with her and her husband, who felt that he could handle her at home with home physical therapy and nursing, she was discharged in stable condition, on 06/08/2011, to be followed as an outpatient.   FINAL DIAGNOSES:  1. 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  2. History of carcinoma of the breast with metastasis to bone, ongoing chemotherapy.  3. History of hypertension.  4. Mild congestive heart failure postoperative, resolved at the time of discharge.   OPERATION PERFORMED: Laparotomy, cystoscopy, resection of sigmoid colon, and repair of the urinary bladder. ____________________________ S.Robinette Haines, MD sgs:slb D: 06/20/2011 22:54:42 ET T: 06/21/2011 10:24:22 ET JOB#: [id number]  cc: Synthia Innocent. Jamal Collin, MD, <Dictator> Delorise Shiner  Donna Bernard, MD Venia Carbon, MD Ronda Fairly. Bernardo Heater, MD [facility] Robinette Haines MD ELECTRONICALLY SIGNED 06/23/2011 10:41

## 2014-05-03 NOTE — Consult Note (Signed)
patient continues to feel weak and tired.  Ambulation is limited.  Afebrile.  Pain is well-controlled.  Abdomen: Soft.  Lungs: Clear Heart within normal limit Lower extremity 1+ edema I discuss this patient with Dr. Jamal Collin.  Possibility of rehab being considered for the next few days to get patient ambulated.  On discharge patient can go home on 25 mcg of fentanyl patch  Electronic Signatures: Brandace Cargle, Martie Lee (MD)  (Signed on 30-May-13 13:23)  Authored  Last Updated: 30-May-13 13:23 by Jobe Gibbon (MD)

## 2014-05-03 NOTE — Consult Note (Signed)
Good UOP.  Renal function good.  Minimal blood in urineneed irrigation if small clots occurchange from GU standpoint.follow  Electronic Signatures: Murrell Redden (MD)  (Signed on 24-May-13 15:31)  Authored  Last Updated: 24-May-13 15:31 by Murrell Redden (MD)

## 2014-05-03 NOTE — Consult Note (Signed)
PATIENT NAME:  Donna Weber, Donna Weber MR#:  [id number] DATE OF BIRTH:  [birth year]  DATE OF CONSULTATION:  05/31/2011  REFERRING PHYSICIAN:  Not dictated CONSULTING PHYSICIAN:  S.G. Jamal Collin, MD  HISTORY: This is a 69 year old female who has breast cancer stage IIA treated initially with local surgery followed by radiation and chemotherapy. The patient had been on antihormonal therapy but showed recurrence of disease in the bone, primarily the low back area, in 2011. The patient since then has had radiation to the spine and also has other focal areas of bony uptake.  Currently she is undergoing chemotherapy again and had shown some response and has done fairly well. The patient was admitted yesterday after she complained of having hematuria and some lower abdominal pain. White count had increased to 23,000.  It was also noted that the patient in fact was passing some stool through the urine. She has a history of having had diverticulitis in the past but had not had any symptoms other than for some intermittent pain in the lower abdominal area.   PAST HISTORY: In addition to her breast cancer with subsequent mets, past medical history includes hypertension. No other major medical illnesses. She has had previous gallbladder removal in addition to her lumpectomy for breast cancer.  REVIEW OF SYSTEMS: The patient does report having had some fever and chills with feeling weak and fatigued. She did also complain of nausea and some vomiting in the last several days and lower abdominal pain, burning sensation with urination, and what she thought was hematuria actually with some stool contents in the urine.   HOSPITAL COURSE: Since admission included IV fluids, urologic consultation has already been obtained from Dr. Bernardo Heater, and a CT scan has been performed.   PHYSICAL EXAMINATION:  GENERAL: The patient does not appear to be any acute distress at this point. She seems reasonably comfortable and feeling a little better in  general since admission.   HEENT: Conjunctivae pink. Tongue is moist, pink, and clear.   NECK: Supple. No nodes or masses palpable.   LUNGS: Clear to auscultation and percussion.   HEART: Sinus rhythm without any murmurs   ABDOMEN: Soft abdomen. There is some ill-defined firmness to the lower abdominal wall in the suprapubic region with some tenderness associated. The rest of the abdomen is essentially unremarkable. Good active bowel sounds noted.  Extremity evaluation is unremarkable. No edema. The patient has good peripheral pulses.   LABORATORY, DIAGNOSTIC, AND RADIOLOGICAL DATA: Lab data shows white count that has decreased from 23,000 on the 20th of  May to 12,000 today. Hemoglobin is low at 8.2. It was 10 at the time of admission two days ago. CT scan was reviewed showing that there is a moderate amount of what looked like fecal content in the urinary bladder, which is distended with it, with an adjoining portion of the sigmoid colon which appears to be the likely source of communication. CT also included the chest showing a mildly enlarged paratracheal lymph node. There is also a pericardial effusion noted. Again, evidence of compression on the body of T12.   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 Given her past history of diverticulitis, it appears that this may be the source of the problem. I feel given the large amount of stool and the fairly frequent passage of stool in the urine it is reasonable to proceed with surgical management. I have discussed this with Dr. Bernardo Heater and recommended that we proceed with a laparotomy, preceded by cystoscopy,  and correction of the problem either with resection and repair of the bladder and possible colostomy. It is remotely possible that the process may be too involved and a simple diverting colostomy may be undertaken at this time without any definitive treatment. I have discussed this in full with the  patient and also with Dr. Bernardo Heater. The patient is agreeable and we will plan to proceed with this tomorrow.    ____________________________ S.Robinette Haines, MD sgs:bjt D: 05/31/2011 20:42:43 ET T: 06/01/2011 07:37:51 ET JOB#: [id number]  cc: Synthia Innocent. Jamal Collin, MD, <Dictator> [facility] Robinette Haines MD ELECTRONICALLY SIGNED 06/04/2011 18:22

## 2014-05-03 NOTE — Consult Note (Signed)
PATIENT NAME:  Donna Weber, Donna Weber MR#:  [id number] DATE OF BIRTH:  1945-09-02  DATE OF CONSULTATION:  05/31/2011  REFERRING PHYSICIAN:  Forest Gleason, MD  CONSULTING PHYSICIAN:  Amyrah Pinkhasov C. Jahmel Flannagan, MD  REASON FOR CONSULTATION: Colovesical fistula.   HISTORY OF PRESENT ILLNESS: The patient is a 69 year old female with metastatic breast cancer. She was admitted on 05/20 with increasing lower abdominal pain and leukocytosis. She had developed hematuria, urinary frequency, urgency, dysuria. 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 She does have a prior history of diverticulitis. She has widespread metastatic breast carcinoma to the bone. She has received radiation therapy to the lumbar spine and SI joints completed in 2011.   PAST MEDICAL HISTORY:  1. Hypertension.  2. Breast cancer.  3. History of diverticulitis.   PAST SURGICAL HISTORY:  1. Lumpectomy.  2. Gallbladder surgery.  3. Venous access port.  ALLERGIES: Afinitor, codeine, penicillin, sulfa.   PHYSICAL EXAMINATION:   GENERAL: Pleasant female in no acute distress.   VITAL SIGNS: Temperature 98.4, blood pressure 92/58, pulse 96.   DATA: Creatinine 0.94. WBC on 05/21 20,000. Urine culture growing mixed bacterial organisms. Second urine culture sent May 20th growing 20,000 coag-negative Staph.   CT scan of the abdomen and pelvis dated 05/30/2011 was reviewed. Kidneys demonstrate no hydronephrosis. There is a loop of sigmoid colon closely applied to the dome of the urinary bladder. There is homogeneous material within the bladder with associated air consistent with either stool or stool-filled bowel.   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 She does have a history of diverticulitis. The etiology of her fistula cannot be determined at this point.   RECOMMENDATION: She has been evaluated by Dr. Jamal Collin whom I have spoken to and he plans  exploratory laparotomy with possibility of colon resection and anastomosis. She may only be a candidate for diverting colostomy which will be determined intraoperatively. I will be available to assist them in the procedure for bladder closure. Will also plan cystoscopy.   ____________________________ Ronda Fairly. Bernardo Heater, MD scs:drc D: 05/31/2011 14:43:34 ET T: 05/31/2011 15:26:59 ET JOB#: [id number]  cc: Nicki Reaper C. Bernardo Heater, MD, <Dictator> Abbie Sons MD ELECTRONICALLY SIGNED 06/08/2011 14:23

## 2014-05-03 NOTE — Consult Note (Signed)
Brief Consult Note: Diagnosis: colovesical fistula.   Patient was seen by consultant.   Comments: Had significant increase in pain with catheter placement- probable poor drainage secondary to particulate matter. Would leave catheter out. Primary management of fistula per general surgery.  Electronic Signatures: Abbie Sons (MD)  (Signed [phone] 09:42)  Authored: Brief Consult Note   Last Updated: 22-May-13 09:42 by Abbie Sons (MD)

## 2014-05-03 NOTE — Op Note (Signed)
PATIENT NAME:  Donna Weber, Donna Weber MR#:  [id number] DATE OF BIRTH:  03-21-1945  DATE OF PROCEDURE:  04/13/2011  PREOPERATIVE DIAGNOSIS: Carcinoma of breast metastatic to bone.  OPERATION PERFORMED: Insertion of venous access port.   SURGEON: Mckinley Jewel, MD    ANESTHESIA: Local with a mixture of 0.5% Marcaine and 1% Xylocaine.   COMPLICATIONS: None.   ESTIMATED BLOOD LOSS: Less than 20 mL.   DRAINS: None.   PROCEDURE: The patient was placed in the supine position on the operating table. The left neck and upper chest were prepped and draped out as a sterile field. This patient had had a previous port that had been removed after initial chemotherapy. Currently with the onset of metastatic focus in the bone, chemo is being to resumed. Ultrasound and fluoroscopic guidance were utilized. The ultrasound probe with a sterile cover was brought up to the field. The subclavian vein beneath the lateral end of the clavicle was easily identified. After instillation of local anesthetic, a small skin nick was made and the needle was successfully positioned in the subclavian vein with withdrawal of blood freely. Following this, guidewire was positioned followed by placement of the introducer and dilator. With fluoroscopic guidance, the catheter was subsequently positioned going into the superior vena cava and towards the right atrium. 20 cm mark was at the skin entrance level. The outer sheath was then removed just above the site of the previous port incision which was a little bit scarred down and puckered. Local anesthetic was instilled and a separate incision was made. Subcutaneous pocket was then created with cautery. The catheter was tunneled through to the port site. It was then cut to approximate length. The catheter was then attached to the prefilled port and secured at its attachment. The port was placed in the pocket and anchored to the underlying fascia with three stitches of 2-0 Prolene. It was then flushed  through with 10 mL of heparinized saline. The fluoroscopy was once again utilized to ensure the catheter was in the right position. The subcutaneous tissue was closed with 3-0 Vicryl, the skin with subcuticular 4-0 Vicryl covered with Dermabond.   The procedure was well tolerated. The patient subsequently was returned to the recovery room in stable condition.  ____________________________ S.Robinette Haines, MD sgs:drc D: 04/13/2011 11:48:42 ET T: 04/13/2011 12:22:59 ET JOB#: [id number]  cc: Synthia Innocent. Jamal Collin, MD, <Dictator> [facility] Robinette Haines MD ELECTRONICALLY SIGNED 04/14/2011 8:24

## 2014-05-03 NOTE — Consult Note (Signed)
PATIENT NAME:  Donna Weber, Donna Weber MR#:  [id number] DATE OF BIRTH:  [birth year]  DATE OF CONSULTATION:  06/05/2011  REFERRING PHYSICIAN:   Dr. Jamal Collin  CONSULTING PHYSICIAN:  Mena Pauls, MD  REASON FOR CONSULTATION: Postoperative colon resection, bladder repair with mild hypoxia and pulmonary congestion.   CHIEF COMPLAINT: "My abdomen is sore."   HISTORY OF PRESENT ILLNESS: 69 year old female who has a history of right breast cancer status post lumpectomy. She is a estrogen receptor and  progesterone receptor positive. She follows with Dr. Oliva Bustard. 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 She had stool coming out of in the urine. She had a CT of the abdomen and pelvis done on 05/30/2011, which showed a considerable amount of stool within the urinary bladder, confirming the suspected fistula. The patient was taken to the OR on 06/01/2011. The patient had a cystoscopy done with closure of the bladder defect, laparotomy with resection of the colon with anastomosis. It was done by Dr. Jamal Collin and Dr. Jacqlyn Larsen together.  Since then the patient has been n.p.o. and on IV hydration. Yesterday the patient was found to be hypoxic. She had dropped down to the high 80s on room air and she was saturating around 90% on 2 liters nasal cannula and 92% on 3 liters nasal cannula. Discussed with the nurse that the patient cannot take a deep breath, also cannot cough out. She is mainly complaining of a dry cough without any phlegm production She denies any chest pain at this time. She was started on clear liquids today. Her chest x-ray was done on 06/04/2011 which shows cardiomegaly with bilateral patchy infiltrates; congestive heart failure cannot be excluded. The patient got 20 mg of IV Lasix yesterday and she had a good urine output of about 3.5 liters.  Still the patient is about 7.8 liters positive during the hospital stay. She said she had mild nausea. She is complaining of some abdominal  soreness postoperatively. She is passing gas, flatus, but she did not have a bowel movement yet. She had an echocardiogram done on 05/31/2011 for possible pericardial effusion on the CT scan, but the echo shows small to moderate pericardial effusion with no RV collapse. LV function was normal. LA normal.   PAST MEDICAL HISTORY:  1. Carcinoma of the breast with is estrogen receptor positive/progesterone receptor positive, HER-2 negative, stage IIA. She had metastasis to bone by PET scan and MRI scan January 2011. The  patient follows with Dr. Oliva Bustard. 2. Hypertension, not on any medication at this time.   PAST SURGICAL HISTORY:  1. Right lumpectomy.  2. Cholecystectomy.  3. Right carotid endarterectomy.   ALLERGIES TO MEDICATIONS:  Afinitor, codeine, penicillin, and sulfa drugs.   SOCIAL HISTORY: She lives with her husband. No smoking or alcohol use.   FAMILY HISTORY: Two sisters had breast cancer. History of diabetes and heart disease in the family.  REVIEW OF SYSTEMS:  She denies any fever. She is weak. No acute change in vision. No headache. No dizziness. She is coughing. She has shortness of breath but no pleuritic chest pain. No palpitations. No syncope. She is nauseated, but not vomiting. She has postoperative abdominal pain. No urinary complaints. The patient has a Foley catheter in. No thyroid problems. No rash. No joint pains. No swelling. No focal numbness or weakness. No anxiety.   PHYSICAL EXAMINATION:  VITAL SIGNS: T-max 99.3, heart rate 108 to 117, and a blood pressure of 123/73 to 136/88. She is saturating around 94%  on 3 liters nasal cannula.   GENERAL: This is an elderly Caucasian female, thin built, wasted in appearance, sick looking, pale in appearance. She is postoperative day four.   HEENT: Bilateral pupils are equal. Extraocular muscles intact. No scleral icterus. No conjunctivitis. Oral mucosa is moist and pale.   NECK: No thyroid tenderness, enlargement, or nodule.  Neck is supple. No masses, nontender. No adenopathy. No JVD. No carotid bruit.   CHEST: She has bilateral diminished breath sounds, rhonchi, some wheezes, some crackles at the bases but normal respiratory effort. Not using accessory muscles of respiration.   HEART: Heart sounds are regular rhythm. No murmur. She has mild lower extremity edema.   ABDOMEN: She is postop laparotomy. She has good bowel sounds. She has a midline scar with some erythema around it. Her drain was taken out today. She has tenderness postop. No hepatosplenomegaly. No bruit. No masses appreciable.   NEUROLOGIC: She is awake. She is alert. She is oriented to time, place, and person, quite weak. Cranial nerves are intact. Moving all extremities against gravity.   EXTREMITIES: No cyanosis. No clubbing.   SKIN: She has some lower extremity edema. Pale.   LABORATORY DATA:  Hemoglobin of 10 today. BMP: Sodium 139, potassium 3.2, BUN 5, creatinine 0.74, bicarbonate 32. Her white count has improved to 15.6 on 06/04/2011. Her white count was 23.6 on 05/29/2011.  During this admission her blood cultures have remained negative. Urine culture one time grew 20,000 colonies of coagulase-negative staph. Repeat urine culture: Mixed bacterial growth. Chest x-ray on 06/04/2011 shows cardiomegaly with patchy pulmonary infiltrate. Congestive heart failure cannot be excluded. She had a CT chest on 05/30/2011, which shows a large pericardial effusion. Cardiac chambers are not enlarged. Patchy interstitial density in the left lung. Atelectasis.   IMPRESSION:  1. Hypoxia. Suspect pulmonary edema, congestive heart failure, may be acute diastolic with normal LV function, also with a component of atelectasis.  2. Hypokalemia.  3. 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  4. Colovesical fistula, status post laparotomy, cystoscopy, bladder repair and resection of the colon. History of breast cancer.  5. Leukocytosis, most  likely secondary to colovesical fistula, which is improving.  PLAN:  69 year old female with history of hypertension. No cardiac history in the past. She has a history of breast cancer. She follows with Dr. Oliva Bustard. She was admitted because of colovesical fistula. She had an echo done at the time of admission because of pericardial effusion, which only showed mild to moderate effusion without any RV collapse. Her LV function is normal. She is postop laparotomy with bladder repair and colon resection done on 06/01/2011. She is postoperative day four. She was found to be hypoxic yesterday. She appears to be in fluid overload. She is about 7.8 liters positive. She got one dose of 20 mg of IV Lasix yesterday. She had good urine output, about 3.5 liters. We will give another dose of 20 mg of IV Lasix along with 40 mEq of p.o. potassium. We will DC her fluids at this time. We will also check a BNP. Her  hemoglobin is stable. Creatinine is stable and her blood pressure is stable. She is tachycardic maybe secondary to fluid overload.  Continue incentive spirometry and physical therapy for mobilization. She started on a clear liquid diet today. We will also start her on albuterol nebulizers with some wheezing, but her wheezing could be just cardiac wheeze also. We will continue to follow the patient.     ____________________________ Gwyndolyn Kaufman  Lyndel Safe, MD ag:bjt D: 06/05/2011 13:20:36 ET T: 06/05/2011 15:57:39 ET JOB#: [id number]  cc: Mena Pauls, MD, <Dictator> Martie Lee. Oliva Bustard, MD Venia Carbon, MD Mena Pauls MD ELECTRONICALLY SIGNED 06/26/2011 11:40

## 2014-05-03 NOTE — Op Note (Signed)
PATIENT NAME:  Donna Weber, Donna Weber MR#:  [id number] DATE OF BIRTH:  [birth year]  DATE OF PROCEDURE:  06/01/2011  PREOPERATIVE DIAGNOSES:  1. Colovesical fistula.  2. History of breast cancer with bone mets.    POSTOPERATIVE DIAGNOSES:  1. Colovesical fistula.  2. History of breast cancer with bone mets.    OPERATION:  1. Cystoscopy. 2. Laparotomy. 3. Resection of sigmoid colon with anastomosis.  4. Repair of  bladder at the colovesical fistula.  SURGEONS: Dr. Jamal Collin and Dr. Bernardo Heater   ANESTHESIA: General.   COMPLICATIONS: None.   ESTIMATED BLOOD LOSS: Approximately 150 mL.   OPERATIVE FINDINGS: The patient had a large amount of stool in the bladder, which was washed out at the time of cystoscopy, and it was noted that the patient had a very large communication with the colon in the dome of the urinary bladder. This was consistent with suspected large colovesical fistula. After the bladder was irrigated thoroughly and Foley catheter was placed, at laparotomy a large area of communication between the mid sigmoid colon and the dome of the urinary bladder was noted with an opening that was at least 3 cm in diameter in both the bladder and the colon. It appeared to be the result of diverticulitis since there was no evidence of any gross tumor at this site nor was there any evidence of tumor elsewhere in the abdominal cavity.  PROCEDURE: Cystoscopy was completed first and this portion will be dictated by Dr. Bernardo Heater. Following this, the patient was placed in the supine position on the operating table. The abdomen was prepped and draped out as a sterile field. A lower vertical midline incision was made from the umbilicus down to the pubic symphysis and carefully deepened into the abdominal cavity. A small amount of peritoneal fluid was encountered, which is clear. Exploration in the abdomen was then performed showing that the process was pretty much limited to the area of the midsigmoid colon being  attached to the urinary bladder. There did not appear to be any evidence of active infection proximal to this area or distal to this area with the colon appearing relatively normal. Palpation of the rest of the abdominal cavity and visualized portions did not show any evidence of metastatic disease from her breast cancer.   After ensuring an adequate exploration, the small bowel was displaced superiorly and held in place with packs and a retractor. The connection between the sigmoid colon and the bladder was carefully peeled away using blunt dissection and noted that the communication was fairly large, as much as 3 cm in size in both the sigmoid colon and in the urinary bladder. After this was freed, clamps were placed on the opening in the sigmoid colon and repair of the bladder was performed. Through this opening irrigation was utilized to clean out the bladder as well as possible of any fecal material following which repair was achieved with interrupted stitches of 2-0 Vicryl and 2-0 chromic. This repair was done by Dr. Bernardo Heater and pressure testing was then utilized by instillation of saline into the Foley catheter to look for leaks until the repair was noted to be fully intact. After this was done, the sigmoid colon was adequately exposed well proximal and distal to the area of involvement. The colon was freed of the mesentery and transected using GIA at both ends. The mesentery was then taken down with the use of the Harmonic device and this segment of the bowel sent to pathology in formalin.  It was noted that the patient's colon was best reapproximated end-to-end. Accordingly, a vertical colotomy was made in the proximal sigmoid colon. With sizers it was decided to use an EEA 33 device. The nose cone was then placed into a small opening at the distal colon near the staple line and held in place with a pursestring stitch of 2-0 Prolene. The instrument was then passed through the proximal colon and the  instrument was opened through the staple line and connected to the nose cone of the device and the two  ends were then brought together, approximated, and the instrument was fired to complete the anastomosis. Two complete rings of colonic tissue were obtained without any evidence of disruption. Palpation from within the colotomy and with irrigation showed no evidence of leaks. The colotomy was then closed with a TA-60 device reinforced with interrupted stitches of 3-0 GI silk. The abdominal cavity was then copiously irrigated with some warm saline. Packs and instruments were removed from the abdominal cavity and counts were declared correct times two both before and after the closure. The peritoneum was closed with a running 2-0 Vicryl stitch. The wound was irrigated. Fascia was closed with interrupted figure-of-eight stitches of 0 Prolene. Subcutaneous tissue was irrigated and closed with 3-0 Vicryl and the skin was approximated with staples. Dry sterile dressing was placed. The patient tolerated the procedure well. No immediate problems were encountered.  Estimated blood loss was approximately 130 mL. The patient, in view of a low hemoglobin to begin with at 8, was transfused 2 units of blood in addition to crystalloid during the procedure. The patient was returned to the recovery room in stable condition after extubation.     ____________________________ S.Robinette Haines, MD sgs:bjt D: 06/02/2011 10:07:48 ET T: 06/02/2011 13:40:49 ET JOB#: [id number]  cc: Synthia Innocent. Jamal Collin, MD, <Dictator> Martie Lee. Oliva Bustard, MD [facility] Robinette Haines MD ELECTRONICALLY SIGNED 06/04/2011 18:23
